# Patient Record
Sex: Female | Born: 1960
Health system: Southern US, Community
[De-identification: ages and names within clinical notes are randomized; demographics above are authoritative.]

## PROBLEM LIST (undated history)

## (undated) DIAGNOSIS — M8580 Other specified disorders of bone density and structure, unspecified site: Secondary | ICD-10-CM

## (undated) DIAGNOSIS — B019 Varicella without complication: Secondary | ICD-10-CM

## (undated) DIAGNOSIS — I341 Nonrheumatic mitral (valve) prolapse: Secondary | ICD-10-CM

## (undated) DIAGNOSIS — T783XXA Angioneurotic edema, initial encounter: Secondary | ICD-10-CM

## (undated) DIAGNOSIS — T7840XA Allergy, unspecified, initial encounter: Secondary | ICD-10-CM

## (undated) DIAGNOSIS — M419 Scoliosis, unspecified: Secondary | ICD-10-CM

## (undated) DIAGNOSIS — IMO0002 Reserved for concepts with insufficient information to code with codable children: Secondary | ICD-10-CM

## (undated) DIAGNOSIS — Z87898 Personal history of other specified conditions: Secondary | ICD-10-CM

## (undated) HISTORY — DX: Scoliosis, unspecified: M41.9

## (undated) HISTORY — DX: Angioneurotic edema, initial encounter: T78.3XXA

## (undated) HISTORY — DX: Reserved for concepts with insufficient information to code with codable children: IMO0002

## (undated) HISTORY — DX: Varicella without complication: B01.9

## (undated) HISTORY — DX: Allergy, unspecified, initial encounter: T78.40XA

## (undated) HISTORY — DX: Nonrheumatic mitral (valve) prolapse: I34.1

## (undated) HISTORY — DX: Other specified disorders of bone density and structure, unspecified site: M85.80

## (undated) HISTORY — PX: WISDOM TOOTH EXTRACTION: SHX21

## (undated) HISTORY — DX: Personal history of other specified conditions: Z87.898

---

## 1990-04-11 HISTORY — PX: LAPAROSCOPIC ABDOMINAL EXPLORATION: SHX6249

## 1997-09-09 ENCOUNTER — Other Ambulatory Visit: Admission: RE | Admit: 1997-09-09 | Discharge: 1997-09-09 | Payer: Self-pay | Admitting: Gynecology

## 1999-02-22 ENCOUNTER — Other Ambulatory Visit: Admission: RE | Admit: 1999-02-22 | Discharge: 1999-02-22 | Payer: Self-pay | Admitting: Gynecology

## 2000-03-06 ENCOUNTER — Other Ambulatory Visit: Admission: RE | Admit: 2000-03-06 | Discharge: 2000-03-06 | Payer: Self-pay | Admitting: Gynecology

## 2001-03-13 ENCOUNTER — Other Ambulatory Visit: Admission: RE | Admit: 2001-03-13 | Discharge: 2001-03-13 | Payer: Self-pay | Admitting: Gynecology

## 2002-03-15 ENCOUNTER — Other Ambulatory Visit: Admission: RE | Admit: 2002-03-15 | Discharge: 2002-03-15 | Payer: Self-pay | Admitting: Gynecology

## 2004-03-12 ENCOUNTER — Other Ambulatory Visit: Admission: RE | Admit: 2004-03-12 | Discharge: 2004-03-12 | Payer: Self-pay | Admitting: Gynecology

## 2005-03-29 ENCOUNTER — Other Ambulatory Visit: Admission: RE | Admit: 2005-03-29 | Discharge: 2005-03-29 | Payer: Self-pay | Admitting: Gynecology

## 2006-04-12 ENCOUNTER — Other Ambulatory Visit: Admission: RE | Admit: 2006-04-12 | Discharge: 2006-04-12 | Payer: Self-pay | Admitting: Gynecology

## 2007-04-19 ENCOUNTER — Other Ambulatory Visit: Admission: RE | Admit: 2007-04-19 | Discharge: 2007-04-19 | Payer: Self-pay | Admitting: Gynecology

## 2009-08-25 ENCOUNTER — Ambulatory Visit: Payer: Self-pay | Admitting: Internal Medicine

## 2009-08-25 DIAGNOSIS — Z780 Asymptomatic menopausal state: Secondary | ICD-10-CM | POA: Insufficient documentation

## 2009-08-25 DIAGNOSIS — T783XXA Angioneurotic edema, initial encounter: Secondary | ICD-10-CM

## 2009-08-25 DIAGNOSIS — I341 Nonrheumatic mitral (valve) prolapse: Secondary | ICD-10-CM | POA: Insufficient documentation

## 2009-08-25 LAB — CONVERTED CEMR LAB
ALT: 19 units/L (ref 0–35)
AST: 21 units/L (ref 0–37)
Albumin: 4.6 g/dL (ref 3.5–5.2)
Anti Nuclear Antibody(ANA): NEGATIVE
Chloride: 103 meq/L (ref 96–112)
Direct LDL: 152.8 mg/dL
Eosinophils Relative: 2.7 % (ref 0.0–5.0)
GFR calc non Af Amer: 101.37 mL/min (ref >60)
Glucose, Bld: 88 mg/dL (ref 70–99)
HCT: 41.8 % (ref 36.0–46.0)
Hemoglobin: 14.4 g/dL (ref 12.0–15.0)
Lymphs Abs: 1.1 10*3/uL (ref 0.7–4.0)
MCV: 93.6 fL (ref 78.0–100.0)
Monocytes Absolute: 0.3 10*3/uL (ref 0.1–1.0)
Monocytes Relative: 8.5 % (ref 3.0–12.0)
Neutro Abs: 2.5 10*3/uL (ref 1.4–7.7)
Potassium: 5.4 meq/L — ABNORMAL HIGH (ref 3.5–5.1)
RDW: 13.6 % (ref 11.5–14.6)
Rhuematoid fact SerPl-aCnc: 24 intl units/mL — ABNORMAL HIGH (ref 0.0–20.0)
Sodium: 144 meq/L (ref 135–145)
TSH: 1.99 microintl units/mL (ref 0.35–5.50)
Vit D, 25-Hydroxy: 38 ng/mL (ref 30–89)
WBC: 4.1 10*3/uL — ABNORMAL LOW (ref 4.5–10.5)

## 2009-09-01 ENCOUNTER — Telehealth: Payer: Self-pay | Admitting: Internal Medicine

## 2009-09-08 ENCOUNTER — Encounter: Payer: Self-pay | Admitting: Internal Medicine

## 2009-09-14 ENCOUNTER — Ambulatory Visit: Payer: Self-pay | Admitting: Internal Medicine

## 2009-09-14 DIAGNOSIS — M129 Arthropathy, unspecified: Secondary | ICD-10-CM | POA: Insufficient documentation

## 2009-10-06 ENCOUNTER — Encounter: Payer: Self-pay | Admitting: Internal Medicine

## 2010-05-13 NOTE — Assessment & Plan Note (Signed)
Summary: cpx/ssc   Vital Signs:  Patient profile:   50 year old female Menstrual status:  postmenopausal Height:      65.25 inches Weight:      129 pounds Pulse rate:   72 / minute BP sitting:   110 / 60  (left arm) Cuff size:   regular  Vitals Entered By: Romualdo Bolk, CMA (AAMA) (September 14, 2009 8:50 AM) CC: CPX without pap- Pt has a gyn who does paps.   History of Present Illness: Jenny Haley comes in today   for preventive visit and follow up of her angioedema of lip.  She has seen allergist and is in process of evaluation . Vistaril no help and now prednisone has helped . Is doing a calendar for allergist.  Prednisone was 40 mg  per day   now  20 mg per day. . No new signs  . does haave some joint aches hands but not new. No other change in health status. Asks about SBE prophylaxis at dentist  Preventive Care Screening  Last Tetanus Booster:    Date:  04/12/2003    Results:  Tdap   Prior Values:    Pap Smear:  normal (06/16/2009)    Mammogram:  normal (06/22/2009)   Preventive Screening-Counseling & Management  Alcohol-Tobacco     Alcohol drinks/day: 0     Smoking Status: never  Caffeine-Diet-Exercise     Caffeine use/day: 0     Does Patient Exercise: yes     Type of exercise: walking      Exercise (avg: min/session): 2 miles      Times/week: 3  Hep-HIV-STD-Contraception     Dental Visit-last 6 months yes     Sun Exposure-Excessive: no  Safety-Violence-Falls     Seat Belt Use: yes     Helmet Use: yes     Firearms in the Home: firearms in the home     Firearm Counseling: not indicated; uses recommended firearm safety measures     Smoke Detectors: yes      Drug Use:  no.        Blood Transfusions:  no.    Current Medications (verified): 1)  Multivitamins   Tabs (Multiple Vitamin) 2)  Fish Oil   Oil (Fish Oil) 3)  Allegra-D 12 Hour 60-120 Mg Xr12h-Tab (Fexofenadine-Pseudoephedrine) 4)  Prednisone 20 Mg Tabs (Prednisone) .... To Take As  Needed For Allergic Reactions. Given By Dr. Stevphen Rochester  Allergies (verified): 1)  ! Penicillin  Past History:  Past medical, surgical, family and social histories (including risk factors) reviewed, and no changes noted (except as noted below).  Past Medical History: primiparous Varicella as a child syncope as a child  Mitral valve prolapse   echo  in 20s    ? Endometriosis  INfertility.  scoliosis. Angioedema   Past Surgical History: Reviewed history from 08/25/2009 and no changes required. Laparotomy-exploratory- 1991 fertility   Past History:  Care Management: Gynecology:  Gaetana Michaelis, [place] Dermatology: Emily Filbert- in the past Allergy: [place]  Family History: Reviewed history from 08/25/2009 and no changes required. Father: Healthy Mother: Arthritis, High cholesterol, HBP  Siblings: Brother- Polycystic Kidney disease Scoliosis    female   on fathers side.   Social History: Reviewed history from 08/25/2009 and no changes required. 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  Never Smoked Alcohol use-no Drug use-no Regular exercise-yes Adopted child  turned  18  no pets  HHof 3  Dental Care w/in 6 mos.:  yes Sun Exposure-Excessive:  no Blood Transfusions:  no  Review of Systems  The patient denies anorexia, fever, weight loss, weight gain, vision loss, decreased hearing, hoarseness, syncope, prolonged cough, abdominal pain, melena, hematochezia, hematuria, abnormal bleeding, and enlarged lymph nodes.    Physical Exam  General:  Well-developed,well-nourished,in no acute distress; alert,appropriate and cooperative throughout examination Head:  normocephalic and atraumatic.   Eyes:  PERRL, EOMs full, conjunctiva clear  Ears:  R ear normal, L ear normal, and no external deformities.   Nose:  no external deformity, no external erythema, and no nasal discharge.   Mouth:  good dentition and pharynx pink and moist.   Neck:  No  deformities, masses, or tenderness noted. Chest Wall:  No deformities, masses, or tenderness noted. Breasts:  No mass, nodules, thickening, tenderness, bulging, retraction, inflamation, nipple discharge or skin changes noted.   Lungs:  Normal respiratory effort, chest expands symmetrically. Lungs are clear to auscultation, no crackles or wheezes.no dullness.   Heart:  Normal rate and regular rhythm. S1 and S2 normal without gallop, murmur, , rub or other extra sounds. OIntermittent mid syst click without murmur  in supine  Abdomen:  Bowel sounds positive,abdomen soft and non-tender without masses, organomegaly or hernias noted. Genitalia:  per gyne  Msk:  no joint swelling, no joint warmth, and no redness over joints.  PIP nodules no synovitis seen  no arachnodactaly   Scoliosis  Pulses:  pulses intact without delay   Extremities:  no clubbing cyanosis or edema  Neurologic:  cranial nerves II-XII intact, strength normal in all extremities, gait normal, and DTRs symmetrical and normal.   Skin:  turgor normal, color normal, no ecchymoses, no petechiae, and no edema.   Cervical Nodes:  No lymphadenopathy noted Axillary Nodes:  No palpable lymphadenopathy Inguinal Nodes:  No significant adenopathy Psych:  Oriented X3, good eye contact, not anxious appearing, and not depressed appearing.   Labs reviewed   mild neutropenia ,  RF positive .    EKG NSR   no acute changes   Impression & Recommendations:  Problem # 1:  PREVENTIVE HEALTH CARE (ICD-V70.0)  suspect the potassium is realted to blood draw.   Orders: EKG w/ Interpretation (93000)  Problem # 2:  ANGIOEDEMA (ICD-995.1)  under evaluation  Orders: Rheumatology Referral (Rheumatology)  Problem # 3:  MITRAL VALVE PROLAPSE (ICD-424.0) ekg nl and no murmur ocass click  Problem # 4:  ARTHRITIS (ICD-716.90)  pos serology  RF ,  but nl esr  done pre  prednisone.  dosing .    Orders: Rheumatology Referral (Rheumatology)  Complete  Medication List: 1)  Multivitamins Tabs (Multiple vitamin) 2)  Fish Oil Oil (Fish oil) 3)  Allegra-d 12 Hour 60-120 Mg Xr12h-tab (Fexofenadine-pseudoephedrine) 4)  Prednisone 20 Mg Tabs (Prednisone) .... To take as needed for allergic reactions. given by dr. Stevphen Rochester  Patient Instructions: 1)  Will be contacted about rheumatology  consult . 2)  Your arthritis is probably osteoarthritis .  3)  No need for antibioitc prophylaxis  for dental procedures.

## 2010-05-13 NOTE — Consult Note (Signed)
Summary: [facility]   Imported By: Maryln Gottron 10/19/2009 12:49:14  _____________________________________________________________________  External Attachment:    Type:   Image     Comment:   External Document

## 2010-05-13 NOTE — Assessment & Plan Note (Signed)
Summary: NEW PT EST // RS   Vital Signs:  Patient profile:   50 year old female Menstrual status:  postmenopausal Height:      65 inches Weight:      131 pounds BMI:     21.88 Temp:     98.1 degrees F oral Pulse rate:   54 / minute BP sitting:   120 / 80  (right arm)  Vitals Entered By: Kathrynn Speed CMA (Aug 25, 2009 9:00 AM) CC: CPX / new pt established     Menstrual Status postmenopausal Last PAP Result normal   History of Present Illness: Tell patient that comesin comes in today  for  new patient visit.  Last PCP  2 year ago. last labs normal   Problem with local lip swelling     recurrent  . this  happens in the evening and awakens in night  and was seen in urgent care  and was given with vistaril     sometimes swells  to ear  . lower right lip  gets tingly   and about 8  times   no associated    NVD resp problem .      no family  hx .       Has picture of swellig and no redness. Hives with strawberries .        See new patient hx .    Preventive Care Screening  Mammogram:    Date:  06/22/2009    Results:  normal   Pap Smear:    Date:  06/16/2009    Results:  normal    Preventive Screening-Counseling & Management  Alcohol-Tobacco     Alcohol drinks/day: 0     Smoking Status: never  Caffeine-Diet-Exercise     Caffeine use/day: 0     Does Patient Exercise: yes     Type of exercise: walking      Exercise (avg: min/session): 2 miles      Times/week: 3  Safety-Violence-Falls     Seat Belt Use: yes     Helmet Use: yes     Firearms in the Home: firearms in the home     Firearm Counseling: not indicated; uses recommended firearm safety measures     Smoke Detectors: yes      Drug Use:  no.    Current Medications (verified): 1)  Multivitamins   Tabs (Multiple Vitamin) 2)  Fish Oil   Oil (Fish Oil)  Allergies (verified): 1)  ! Penicillin  Past History:  Past Medical History: primiparous Varicella as a child suncope as a child  Mitral valve  prolapse   echo  in 20s    ? Endometriosis  INfertility.   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  Never Smoked Alcohol use-no Drug use-no Regular exercise-yes Adopted child  turned  18  no pets  HHof 3  Smoking Status:  never Caffeine use/day:  0 Does Patient Exercise:  yes Occupation:  employed Drug Use:  no Seat Belt Use:  yes  Review of Systems  The patient denies anorexia, fever, weight loss, weight gain, vision loss, decreased hearing, hoarseness, chest pain, syncope, dyspnea on exertion, peripheral edema, prolonged cough, headaches, hemoptysis, abdominal pain,  melena, hematochezia, severe indigestion/heartburn, hematuria, incontinence, muscle weakness, suspicious skin lesions, transient blindness, difficulty walking, depression, unusual weight change, abnormal bleeding, enlarged lymph nodes, and breast masses.         mild arthritis in hands no rashes or itching and no seasonal allergies   Physical Exam  General:  Well-developed,well-nourished,in no acute distress; alert,appropriate and cooperative throughout examination Head:  normocephalic and atraumatic.   Eyes:  vision grossly intact, pupils equal, and pupils round.   Ears:  R ear normal, L ear normal, and no external deformities.   Nose:  no external deformity and no external erythema.   Mouth:  good dentition and pharynx pink and moist.  some cobblestoning  Neck:  No deformities, masses, or tenderness noted. Lungs:  Normal respiratory effort, chest expands symmetrically. Lungs are clear to auscultation, no crackles or wheezes.no dullness.   Heart:  Normal rate and regular rhythm. S1 and  S2 normal without gallop, murmur, click, rub or other extra sounds. I dont hear a click today  Abdomen:  Bowel sounds positive,abdomen soft and non-tender without masses, organomegaly or hernias noted. Msk:  mild dip changes no effusions and no rashes  Pulses:  pulses intact without delay   Extremities:  no clubbing cyanosis or edema  Neurologic:  alert & oriented X3, strength normal in all extremities, and gait normal.  grossly normal  Skin:  turgor normal, color normal, no ecchymoses, and no petechiae.   Cervical Nodes:  No lymphadenopathy noted Psych:  Normal eye contact, appropriate affect. Cognition appears normal.    Impression & Recommendations:  Problem # 1:  ANGIOEDEMA (ICD-995.1) unknown cause  fortunately localized and no alarm findings   / cause  Orders: TLB-BMP (Basic Metabolic Panel-BMET) (80048-METABOL) TLB-CBC Platelet - w/Differential (85025-CBCD) TLB-Hepatic/Liver Function Pnl (80076-HEPATIC) TLB-TSH (Thyroid Stimulating Hormone) (84443-TSH) TLB-Lipid Panel (80061-LIPID) TLB-Sedimentation Rate (ESR) (85652-ESR) TLB-Rheumatoid Factor (RA) (16109-UE) T-Antinuclear Antib (ANA) [phone]) T-Vitamin D (25-Hydroxy) (47829-56213) Venipuncture (08657) Allergy Referral  (Allergy)  Problem # 2:  MITRAL VALVE PROLAPSE (ICD-424.0) no murmur heard    Orders: TLB-BMP (Basic Metabolic Panel-BMET) (80048-METABOL) TLB-CBC Platelet - w/Differential (85025-CBCD) TLB-Hepatic/Liver Function Pnl (80076-HEPATIC) TLB-TSH (Thyroid Stimulating Hormone) (84443-TSH) TLB-Lipid Panel (80061-LIPID) TLB-Sedimentation Rate (ESR) (85652-ESR) TLB-Rheumatoid Factor (RA) (84696-EX) T-Antinuclear Antib (ANA) [phone]) T-Vitamin D (25-Hydroxy) [phone])  Problem # 3:  ASYMPTOMATIC POSTMENOPAUSAL STATUS (ICD-V49.81)  Orders: T-Vitamin D (25-Hydroxy) (40347-42595)  Complete Medication List: 1)  Multivitamins Tabs (Multiple vitamin) 2)  Fish Oil Oil (Fish oil) 3)  Hydroxyzine  Pamoate 25 Mg Caps (Hydroxyzine pamoate) .Marland Kitchen.. 1 by mouth three times a day as needed swelling allergic reaction  Patient Instructions: 1)  schedule for CPX  ( any slot but thursday am  2)  Will notify you about allergy consult. 3)  Can try vistaryl in the meantime if necessary.  Prescriptions: HYDROXYZINE PAMOATE 25 MG CAPS (HYDROXYZINE PAMOATE) 1 by mouth three times a day as needed swelling allergic reaction  #30 x 1   Entered and Authorized by:   Madelin Headings MD   Signed by:   Madelin Headings MD on 08/25/2009   Method used:   Electronically to        [facility]* (retail)       [address]       [place], [place]  [id number]       Ph: [phone]       Fax: [phone]   RxID:   [id number]

## 2010-05-13 NOTE — Consult Note (Signed)
Summary: [facility]  [facility]   Imported By: Maryln Gottron 10/23/2009 15:18:22  _____________________________________________________________________  External Attachment:    Type:   Image     Comment:   External Document

## 2010-05-13 NOTE — Progress Notes (Signed)
Summary: Pt saw urgent care on sat  Phone Note Call from Patient Call back at Home Phone [phone] Call back at [phone]   Caller: Patient Summary of Call: Pt called saying that she had to see an Urgent care on Sat for allergic reaction. Pt was told that she needed to d/c antihistimines 4 days prior to appt. Pt is also going to start prednisone today. Pt said to leave her a voicemail on what to do. I left her a message to call us back to let us know what is going on and to call the allergist that she is seeing to see what they want her to do. Initial call taken by: Romualdo Bolk, CMA Duncan Dull),  Sep 01, 2009 10:41 AM  Follow-up for Phone Call        Pt called back saying that she she had a  allergy reaction that started on 5/21 until 5/22. It lasted more thatn 12 hours to left side of lower lip and left side of face with swelling and itching. Pt went to Urgent Care on [address]. Follow-up by: Romualdo Bolk, CMA Duncan Dull),  Sep 01, 2009 12:44 PM  Additional Follow-up for Phone Call Additional follow up Details #1::        Spoke to pt and they gave her singulair and prednisone at [facility] . Vistril did nothing. Pt states that she called [facility] and they told her that she could take Singulair and Prednisone. Additional Follow-up by: Romualdo Bolk, CMA Duncan Dull),  Sep 01, 2009 12:47 PM

## 2011-06-09 DIAGNOSIS — J309 Allergic rhinitis, unspecified: Secondary | ICD-10-CM | POA: Insufficient documentation

## 2013-01-09 LAB — HM MAMMOGRAPHY: HM Mammogram: NORMAL

## 2013-03-13 ENCOUNTER — Ambulatory Visit (INDEPENDENT_AMBULATORY_CARE_PROVIDER_SITE_OTHER): Payer: BC Managed Care – PPO | Admitting: Internal Medicine

## 2013-03-13 ENCOUNTER — Encounter: Payer: Self-pay | Admitting: Internal Medicine

## 2013-03-13 VITALS — BP 122/84 | HR 94 | Temp 98.1°F | Ht 66.0 in | Wt 133.0 lb

## 2013-03-13 DIAGNOSIS — Z1211 Encounter for screening for malignant neoplasm of colon: Secondary | ICD-10-CM

## 2013-03-13 DIAGNOSIS — M8580 Other specified disorders of bone density and structure, unspecified site: Secondary | ICD-10-CM

## 2013-03-13 DIAGNOSIS — Z0000 Encounter for general adult medical examination without abnormal findings: Secondary | ICD-10-CM

## 2013-03-13 DIAGNOSIS — M899 Disorder of bone, unspecified: Secondary | ICD-10-CM

## 2013-03-13 DIAGNOSIS — Z780 Asymptomatic menopausal state: Secondary | ICD-10-CM

## 2013-03-13 DIAGNOSIS — I059 Rheumatic mitral valve disease, unspecified: Secondary | ICD-10-CM

## 2013-03-13 DIAGNOSIS — R1011 Right upper quadrant pain: Secondary | ICD-10-CM

## 2013-03-13 LAB — CBC WITH DIFFERENTIAL/PLATELET
Basophils Relative: 0.4 % (ref 0.0–3.0)
Eosinophils Absolute: 0.1 10*3/uL (ref 0.0–0.7)
Eosinophils Relative: 2.6 % (ref 0.0–5.0)
Lymphocytes Relative: 20.3 % (ref 12.0–46.0)
Lymphs Abs: 1 10*3/uL (ref 0.7–4.0)
MCHC: 33.7 g/dL (ref 30.0–36.0)
Monocytes Relative: 8.6 % (ref 3.0–12.0)
Neutrophils Relative %: 68.1 % (ref 43.0–77.0)
RBC: 4.61 Mil/uL (ref 3.87–5.11)
WBC: 5.1 10*3/uL (ref 4.5–10.5)

## 2013-03-13 LAB — LIPID PANEL
Cholesterol: 252 mg/dL — ABNORMAL HIGH (ref 0–200)
HDL: 69 mg/dL (ref >39.00)
VLDL: 26.8 mg/dL (ref 0.0–40.0)

## 2013-03-13 LAB — BASIC METABOLIC PANEL
CO2: 28 mEq/L (ref 19–32)
Calcium: 9.6 mg/dL (ref 8.4–10.5)
Creatinine, Ser: 0.7 mg/dL (ref 0.4–1.2)
GFR: 99.93 mL/min (ref >60.00)
Sodium: 139 mEq/L (ref 135–145)

## 2013-03-13 LAB — TSH: TSH: 0.76 u[IU]/mL (ref 0.35–5.50)

## 2013-03-13 LAB — HEPATIC FUNCTION PANEL
ALT: 17 U/L (ref 0–35)
AST: 22 U/L (ref 0–37)
Alkaline Phosphatase: 57 U/L (ref 39–117)
Bilirubin, Direct: 0 mg/dL (ref 0.0–0.3)
Total Protein: 7.5 g/dL (ref 6.0–8.3)

## 2013-03-13 LAB — LDL CHOLESTEROL, DIRECT: Direct LDL: 162.2 mg/dL

## 2013-03-13 NOTE — Progress Notes (Signed)
Chief Complaint  Patient presents with  . Establish Care    Needs to re-establish    HPI: Patient comes in today for reestablisingCare visit  Last seen over 3 years ago  GYNE: dr Chevis Pretty   Is generally well but had a hard year last year father was diagnosed with esophageal cancer and died within 6 weeks the diagnosis mother had endometrial cancer although is doing well now.  Major injuries but did have an episode of right upper quadrant pain after vacuuming doing housework felt to be musculoskeletal given Mobic and a muscle relaxant is much better but advice to recheck her physical exam.  Angioedema no recurrence but sees allergist yearly hasn't needed to use the EpiPen  Mitral valve prolapse diagnosed with echo long time ago no associated symptoms tachycardia asymptomatic.  1 density reported it osteopenia was told to take calcium vitamin D has some questions about that.  Declines flu vaccine  Never hda colonoscopy  Is due   ROS:  GEN/ HEENT: No fever, significant weight changes sweats headaches vision problems hearing changes, CV/ PULM; No chest pain shortness of breath cough, syncope,edema  change in exercise tolerance. GI /GU: No adominal pain, vomiting, change in bowel habits. No blood in the stool. No significant GU symptoms. SKIN/HEME: ,no acute skin rashes suspicious lesions or bleeding. No lymphadenopathy, nodules, masses.  NEURO/ PSYCH:  No neurologic signs such as weakness numbness. No depression anxiety. IMM/ Allergy: No unusual infections.  Allergy .   REST of 12 system review negative except as per HPI   Past Medical History  Diagnosis Date  . H/O angioedema     neg rheum pos rf dr Dareen Piano 2011  . Mitral valve prolapse     echo years ago fainting in childhood   . Allergy   . Angioedema     eval by rheum and allergy dr Irena Cords now  . Osteopenia   . Chicken pox   . Infertility     Family History  Problem Relation Age of Onset  . Cancer Mother 57     Endometrial  . Hyperlipidemia Mother   . Hypertension Mother   . Arthritis Mother     hands   . Esophageal cancer Father     Esophageal with metastisis to the liver  . Polycystic kidney disease Brother     congental cr 5 range transplan candidate  . Hypertension Brother     History   Social History  . Marital Status: Married    Spouse Name: N/A    Number of Children: N/A  . Years of Education: N/A   Social History Main Topics  . Smoking status: Never Smoker   . Smokeless tobacco: None  . Alcohol Use: No  . Drug Use: None  . Sexual Activity: None   Other Topics Concern  . None   Social History Narrative   Sleeps about 6-7 hours of sleep per night (thanks to menopause)   2 people living in the home   Has a small dog in the home    p0 g0   No tobacco ets    Husband IT  [employer] degree  Homemaker  Caring for parents    Neg tad    Outpatient Encounter Prescriptions as of 03/13/2013  Medication Sig  . cyclobenzaprine (FLEXERIL) 5 MG tablet 1-2 tablets by mouth up to three times daily  . meloxicam (MOBIC) 15 MG tablet Take 15 mg by mouth daily. With a meal  .  Multiple Vitamin (MULTI VITAMIN DAILY PO) Take by mouth.    EXAM:  BP 122/84  Pulse 94  Temp(Src) 98.1 F (36.7 C) (Oral)  Ht 5\' 6"  (1.676 m)  Wt 133 lb (60.328 kg)  BMI 21.48 kg/m2  SpO2 98%  Body mass index is 21.48 kg/(m^2).  Physical Exam: Vital signs reviewed WUJ:WJXB is a well-developed well-nourished alert cooperative   female who appears her stated age in no acute distress.  HEENT: normocephalic atraumatic , Eyes: PERRL EOM's full, conjunctiva clear, Nares: paten,t no deformity discharge or tenderness., Ears: no deformity EAC's clear TMs with normal landmarks. Mouth: clear OP, no lesions, edema.  Moist mucous membranes. Dentition in adequate repair. NECK: supple without masses, thyromegaly or bruits. CHEST/PULM:  Clear to auscultation and percussion breath sounds equal no wheeze , rales  or rhonchi.no tenderness has mild scoliosis assymmetry  CV: PMI is nondisplaced, S1 S2 no gallops, murmurs, rubs. ? intermettent click supine only Peripheral pulses are full without delay.No JVD .  ABDOMEN: Bowel sounds normal nontender  No guard or rebound, no hepato splenomegal no CVA tenderness.  No hernia. Area of min tender mid ruq near rib cage and musc insertion no mass felt Extremtities:  No clubbing cyanosis or edema, no acute joint swelling or redness no focal atrophy NEURO:  Oriented x3, cranial nerves 3-12 appear to be intact, no obvious focal weakness,gait within normal limitsSKIN: No acute rashes normal turgor, color, no bruising or petechiae. PSYCH: Oriented, good eye contact, no obvious depression anxiety, cognition and judgment appear normal. LN: no cervical adenopathy  Lab Results  Component Value Date   WBC 5.1 03/13/2013   HGB 14.3 03/13/2013   HCT 42.5 03/13/2013   PLT 314.0 03/13/2013   GLUCOSE 75 03/13/2013   CHOL 252* 03/13/2013   TRIG 134.0 03/13/2013   HDL 69.00 03/13/2013   LDLDIRECT 162.2 03/13/2013   ALT 17 03/13/2013   AST 22 03/13/2013   NA 139 03/13/2013   K 3.8 03/13/2013   CL 104 03/13/2013   CREATININE 0.7 03/13/2013   BUN 18 03/13/2013   CO2 28 03/13/2013   TSH 0.76 03/13/2013    ASSESSMENT AND PLAN:  Discussed the following assessment and plan:  Visit for preventive health examination - declines flu vaccine - Plan: Multiple Vitamin (MULTI VITAMIN DAILY PO), cyclobenzaprine (FLEXERIL) 5 MG tablet, meloxicam (MOBIC) 15 MG tablet, HM MAMMOGRAPHY, HM PAP SMEAR, HM DEXA SCAN, Basic metabolic panel, CBC with Differential, Hepatic function panel, Lipid panel, TSH, Hepatitis C antibody  MITRAL VALVE PROLAPSE - no m heard and no sx follow exam - Plan: Multiple Vitamin (MULTI VITAMIN DAILY PO), cyclobenzaprine (FLEXERIL) 5 MG tablet, meloxicam (MOBIC) 15 MG tablet, HM MAMMOGRAPHY, HM PAP SMEAR, HM DEXA SCAN, Basic metabolic panel, CBC with Differential, Hepatic function  panel, Lipid panel, TSH, Hepatitis C antibody  ASYMPTOMATIC POSTMENOPAUSAL STATUS - Plan: Multiple Vitamin (MULTI VITAMIN DAILY PO), cyclobenzaprine (FLEXERIL) 5 MG tablet, meloxicam (MOBIC) 15 MG tablet, HM MAMMOGRAPHY, HM PAP SMEAR, HM DEXA SCAN, Basic metabolic panel, CBC with Differential, Hepatic function panel, Lipid panel, TSH, Hepatitis C antibody  RUQ abdominal pain hx improved  - poss ms consider  [place] if recurrent progressive etc  - Plan: Multiple Vitamin (MULTI VITAMIN DAILY PO), cyclobenzaprine (FLEXERIL) 5 MG tablet, meloxicam (MOBIC) 15 MG tablet, HM MAMMOGRAPHY, HM PAP SMEAR, HM DEXA SCAN, Basic metabolic panel, CBC with Differential, Hepatic function panel, Lipid panel, TSH, Hepatitis C antibody  Osteopenia - Plan: Multiple Vitamin (MULTI VITAMIN DAILY PO), cyclobenzaprine (FLEXERIL)  5 MG tablet, meloxicam (MOBIC) 15 MG tablet, HM MAMMOGRAPHY, HM PAP SMEAR, HM DEXA SCAN, Basic metabolic panel, CBC with Differential, Hepatic function panel, Lipid panel, TSH, Hepatitis C antibody  Special screening for malignant neoplasms, colon - Plan: Ambulatory referral to Gastroenterology Counseled regarding healthy nutrition, exercise, sleep, injury prevention, calcium vit d and healthy weight .  Patient Care Team: Madelin Headings, MD as PCP - General Scharlene Gloss, MD (Allergy and Immunology) Janifer Adie, MD as Consulting Physician (Gynecology) Patient Instructions  150 minutes of exercise weeks  , weight  To healthy levels. Avoid trans fats and processed foods;  Increase fresh fruits and veges to 5 servings per day. And avoid sweet beverages  Including tea and juice. Will notify you  of labs when available. Reconsider flu vaccine at any time/ Advise vit d 600 - 800 per day vitr d 3  Many peopl use the 1000 iu supplement   Will refer for colonoscopy     Bone Health Our bones do many things. They provide structure, protect organs, anchor muscles, and store calcium. Adequate calcium  in your diet and weight-bearing physical activity help build strong bones, improve bone amounts, and may reduce the risk of weakening of bones (osteoporosis) later in life. PEAK BONE MASS By age 73, the average woman has acquired most of her skeletal bone mass. A large decline occurs in older adults which increases the risk of osteoporosis. In women this occurs around the time of menopause. It is important for young girls to reach their peak bone mass in order to maintain bone health throughout life. A person with high bone mass as a young adult will be more likely to have a higher bone mass later in life. Not enough calcium consumption and physical activity early on could result in a failure to achieve optimum bone mass in adulthood. OSTEOPOROSIS Osteoporosis is a disease of the bones. It is defined as low bone mass with deterioration of bone structure. Osteoporosis leads to an increase risk of fractures with falls. These fractures commonly happen in the wrist, hip, and spine. While men and women of all ages and background can develop osteoporosis, some of the risk factors for osteoporosis are:  Female.  White.  Postmenopausal.  Older adults.  Small in body size.  Eating a diet low in calcium.  Physically inactive.  Smoking.  Use of some medications.  Family history. CALCIUM Calcium is a mineral needed by the body for healthy bones, teeth, and proper function of the heart, muscles, and nerves. The body cannot produce calcium so it must be absorbed through food. Good sources of calcium include:  Dairy products (low fat or nonfat milk, cheese, and yogurt).  Dark green leafy vegetables (bok choy and broccoli).  Calcium fortified foods (orange juice, cereal, bread, soy beverages, and tofu products).  Nuts (almonds). Recommended amounts of calcium vary for individuals. RECOMMENDED CALCIUM INTAKES Age and Amount in mg per day  Children 1 to 3 years / 700 mg  Children 4 to 8  years / 1,000 mg  Children 9 to 13 years / 1,300 mg  Teens 14 to 18 years / 1,300 mg  Adults 19 to 50 years / 1,000 mg  Adult women 51 to 70 years / 1,200 mg  Adults 71 years and older / 1,200 mg  Pregnant and breastfeeding teens / 1,300 mg  Pregnant and breastfeeding adults / 1,000 mg Vitamin D also plays an important role in healthy bone development. Vitamin  D helps in the absorption of calcium. WEIGHT-BEARING PHYSICAL ACTIVITY Regular physical activity has many positive health benefits. Benefits include strong bones. Weight-bearing physical activity early in life is important in reaching peak bone mass. Weight-bearing physical activities cause muscles and bones to work against gravity. Some examples of weight bearing physical activities include:  Walking, jogging, or running.  DIRECTV.  Jumping rope.  Dancing.  Soccer.  Tennis or Racquetball.  Stair climbing.  Basketball.  Hiking.  Weight lifting.  Aerobic fitness classes. Including weight-bearing physical activity into an exercise plan is a great way to keep bones healthy. Adults: Engage in at least 30 minutes of moderate physical activity on most, preferably all, days of the week. Children: Engage in at least 60 minutes of moderate physical activity on most, preferably all, days of the week. FOR MORE INFORMATION [place] [employer] for [employer] and Promotion: www.cnpp.usda.gov National Osteoporosis Foundation: [employer] Document Released: 06/18/2003 Document Revised: 07/23/2012 Document Reviewed: 09/17/2008 [facility] Patient Information 2014 [place], [place].      Neta Mends. Aniken Monestime M.D.   Pre visit review using our clinic review tool, if applicable. No additional management support is needed unless otherwise documented below in the visit note.

## 2013-03-13 NOTE — Patient Instructions (Signed)
150 minutes of exercise weeks  , weight  To healthy levels. Avoid trans fats and processed foods;  Increase fresh fruits and veges to 5 servings per day. And avoid sweet beverages  Including tea and juice. Will notify you  of labs when available. Reconsider flu vaccine at any time/ Advise vit d 600 - 800 per day vitr d 3  Many peopl use the 1000 iu supplement   Will refer for colonoscopy     Bone Health Our bones do many things. They provide structure, protect organs, anchor muscles, and store calcium. Adequate calcium in your diet and weight-bearing physical activity help build strong bones, improve bone amounts, and may reduce the risk of weakening of bones (osteoporosis) later in life. PEAK BONE MASS By age 64, the average woman has acquired most of her skeletal bone mass. A large decline occurs in older adults which increases the risk of osteoporosis. In women this occurs around the time of menopause. It is important for young girls to reach their peak bone mass in order to maintain bone health throughout life. A person with high bone mass as a young adult will be more likely to have a higher bone mass later in life. Not enough calcium consumption and physical activity early on could result in a failure to achieve optimum bone mass in adulthood. OSTEOPOROSIS Osteoporosis is a disease of the bones. It is defined as low bone mass with deterioration of bone structure. Osteoporosis leads to an increase risk of fractures with falls. These fractures commonly happen in the wrist, hip, and spine. While men and women of all ages and background can develop osteoporosis, some of the risk factors for osteoporosis are:  Female.  White.  Postmenopausal.  Older adults.  Small in body size.  Eating a diet low in calcium.  Physically inactive.  Smoking.  Use of some medications.  Family history. CALCIUM Calcium is a mineral needed by the body for healthy bones, teeth, and proper function of  the heart, muscles, and nerves. The body cannot produce calcium so it must be absorbed through food. Good sources of calcium include:  Dairy products (low fat or nonfat milk, cheese, and yogurt).  Dark green leafy vegetables (bok choy and broccoli).  Calcium fortified foods (orange juice, cereal, bread, soy beverages, and tofu products).  Nuts (almonds). Recommended amounts of calcium vary for individuals. RECOMMENDED CALCIUM INTAKES Age and Amount in mg per day  Children 52 years / 700 mg  Children 52 years / 1,000 mg  Children 52 years / 1,300 mg  Teens 52 years / 1,300 mg  Adults 52 to 52 years / 1,000 mg  Adult women 52 to 52 years / 1,200 mg  Adults 71 years and older / 1,200 mg  Pregnant and breastfeeding teens / 1,300 mg  Pregnant and breastfeeding adults / 1,000 mg Vitamin D also plays an important role in healthy bone development. Vitamin D helps in the absorption of calcium. WEIGHT-BEARING PHYSICAL ACTIVITY Regular physical activity has many positive health benefits. Benefits include strong bones. Weight-bearing physical activity early in life is important in reaching peak bone mass. Weight-bearing physical activities cause muscles and bones to work against gravity. Some examples of weight bearing physical activities include:  Walking, jogging, or running.  DIRECTV.  Jumping rope.  Dancing.  Soccer.  Tennis or Racquetball.  Stair climbing.  Basketball.  Hiking.  Weight lifting.  Aerobic fitness classes. Including weight-bearing physical activity  into an exercise plan is a great way to keep bones healthy. Adults: Engage in at least 30 minutes of moderate physical activity on most, preferably all, days of the week. Children: Engage in at least 60 minutes of moderate physical activity on most, preferably all, days of the week. FOR MORE INFORMATION [place] [employer], [employer]ographer for [employer] and  Promotion: www.cnpp.usda.gov National Osteoporosis Foundation: [employer] Document Released: 06/18/2003 Document Revised: 07/23/2012 Document Reviewed: 09/17/2008 [facility] Patient Information 2014 [place], [place].

## 2013-03-14 LAB — HEPATITIS C ANTIBODY: HCV Ab: NEGATIVE

## 2013-03-25 ENCOUNTER — Encounter: Payer: Self-pay | Admitting: Gastroenterology

## 2013-04-24 ENCOUNTER — Ambulatory Visit (AMBULATORY_SURGERY_CENTER): Payer: Self-pay | Admitting: *Deleted

## 2013-04-24 VITALS — Ht 66.5 in | Wt 129.8 lb

## 2013-04-24 DIAGNOSIS — Z1211 Encounter for screening for malignant neoplasm of colon: Secondary | ICD-10-CM

## 2013-04-24 MED ORDER — NA SULFATE-K SULFATE-MG SULF 17.5-3.13-1.6 GM/177ML PO SOLN: 1.0000 | Freq: Once | ORAL | Status: DC

## 2013-04-24 NOTE — Progress Notes (Signed)
Denies allergies to eggs or soy products. Denies complications with sedation or anesthesia. 

## 2013-04-25 ENCOUNTER — Encounter: Payer: Self-pay | Admitting: Gastroenterology

## 2013-05-02 ENCOUNTER — Encounter: Payer: Self-pay | Admitting: Gastroenterology

## 2013-05-02 ENCOUNTER — Ambulatory Visit (AMBULATORY_SURGERY_CENTER): Payer: Managed Care, Other (non HMO) | Admitting: Gastroenterology

## 2013-05-02 VITALS — BP 115/74 | HR 79 | Temp 98.2°F | Resp 23 | Ht 66.0 in | Wt 129.0 lb

## 2013-05-02 DIAGNOSIS — Z1211 Encounter for screening for malignant neoplasm of colon: Secondary | ICD-10-CM

## 2013-05-02 MED ORDER — SODIUM CHLORIDE 0.9 % IV SOLN: 500.0000 mL | INTRAVENOUS | Status: DC

## 2013-05-02 NOTE — Op Note (Signed)
[facility] [address] [place], [id number]   COLONOSCOPY PROCEDURE REPORT  PATIENT: [name], Jenny J.  MR#: [id number] BIRTHDATE: 23-Oct-1960 , 52  yrs. old GENDER: Female ENDOSCOPIST: Louis Meckelobert D Mariadelaluz Gug[place], MD REFERRED XB:JYNWGBY:Wanda Lonie PeakK Panosh, M.D. PROCEDURE DATE:  05/02/2013 PROCEDURE:   Colonoscopy, diagnostic First Screening Colonoscopy - Avg.  risk and is 50 yrs.  old or older Yes.  Prior Negative Screening - Now for repeat screening. N/A  History of Adenoma - Now for follow-up colonoscopy & has been > or = to 3 yrs.  N/A  Polyps Removed Today? No.  Recommend repeat exam, <10 yrs? No. ASA CLASS:   Class II INDICATIONS:Average risk patient for colon cancer. MEDICATIONS: propofol (Diprivan) 300mg  IV  DESCRIPTION OF PROCEDURE:   After the risks benefits and alternatives of the procedure were thoroughly explained, informed consent was obtained.  A digital rectal exam revealed no abnormalities of the rectum.   The LB [id number]  endoscope was introduced through the anus and advanced to the cecum, which was identified by both the appendix and ileocecal valve. No adverse events experienced.   The quality of the prep was excellent using Suprep  The instrument was then slowly withdrawn as the colon was fully examined.      COLON FINDINGS: A normal appearing cecum, ileocecal valve, and appendiceal orifice were identified.  The ascending, hepatic flexure, transverse, splenic flexure, descending, sigmoid colon and rectum appeared unremarkable.  No polyps or cancers were seen. Retroflexed views revealed no abnormalities. The time to cecum=3 minutes 46 seconds.  Withdrawal time=11 minutes 21 seconds.  The scope was withdrawn and the procedure completed. COMPLICATIONS: There were no complications.  ENDOSCOPIC IMPRESSION: Normal colon  RECOMMENDATIONS: Continue current colorectal screening recommendations for "routine risk" patients with a repeat colonoscopy  in 10 years.   eSigned:  Louis Meckelobert D Odetta Forness, MD 05/02/2013 2:56 PM   cc:   PATIENT NAME:  [name], Jenny J. MR#: [id number]

## 2013-05-02 NOTE — Progress Notes (Signed)
A/ox3 pleased with MAC, report to Jane RN 

## 2013-05-02 NOTE — Patient Instructions (Signed)
YOU HAD AN ENDOSCOPIC PROCEDURE TODAY AT THE [facility]: Refer to the procedure report that was given to you for any specific questions about what was found during the examination.  If the procedure report does not answer your questions, please call your gastroenterologist to clarify.  If you requested that your care partner not be given the details of your procedure findings, then the procedure report has been included in a sealed envelope for you to review at your convenience later.  YOU SHOULD EXPECT: Some feelings of bloating in the abdomen. Passage of more gas than usual.  Walking can help get rid of the air that was put into your GI tract during the procedure and reduce the bloating. If you had a lower endoscopy (such as a colonoscopy or flexible sigmoidoscopy) you may notice spotting of blood in your stool or on the toilet paper. If you underwent a bowel prep for your procedure, then you may not have a normal bowel movement for a few days.  DIET: Your first meal following the procedure should be a light meal and then it is ok to progress to your normal diet.  A half-sandwich or bowl of soup is an example of a good first meal.  Heavy or fried foods are harder to digest and may make you feel nauseous or bloated.  Likewise meals heavy in dairy and vegetables can cause extra gas to form and this can also increase the bloating.  Drink plenty of fluids but you should avoid alcoholic beverages for 24 hours.  ACTIVITY: Your care partner should take you home directly after the procedure.  You should plan to take it easy, moving slowly for the rest of the day.  You can resume normal activity the day after the procedure however you should NOT DRIVE or use heavy machinery for 24 hours (because of the sedation medicines used during the test).    SYMPTOMS TO REPORT IMMEDIATELY: A gastroenterologist can be reached at any hour.  During normal business hours, 8:30 AM to 5:00 PM Monday through Friday,  call [phone].  After hours and on weekends, please call the GI answering service at [phone] who will take a message and have the physician on call contact you.   Following lower endoscopy (colonoscopy or flexible sigmoidoscopy):  Excessive amounts of blood in the stool  Significant tenderness or worsening of abdominal pains  Swelling of the abdomen that is new, acute  Fever of 100F or higher    FOLLOW UP: If any biopsies were taken you will be contacted by phone or by letter within the next 1-3 weeks.  Call your gastroenterologist if you have not heard about the biopsies in 3 weeks.  Our staff will call the home number listed on your records the next business day following your procedure to check on you and address any questions or concerns that you may have at that time regarding the information given to you following your procedure. This is a courtesy call and so if there is no answer at the home number and we have not heard from you through the emergency physician on call, we will assume that you have returned to your regular daily activities without incident.  SIGNATURES/CONFIDENTIALITY: You and/or your care partner have signed paperwork which will be entered into your electronic medical record.  These signatures attest to the fact that that the information above on your After Visit Summary has been reviewed and is understood.  Full responsibility of the confidentiality   of this discharge information lies with you and/or your care-partner.  Normal colonoscopy-Repeat in 10 years-2025. 

## 2013-05-03 ENCOUNTER — Telehealth: Payer: Self-pay | Admitting: *Deleted

## 2013-05-03 NOTE — Telephone Encounter (Signed)
  Follow up Call-  Call back number 05/02/2013  Post procedure Call Back phone  # [phone]  Permission to leave phone message Yes     Patient questions:  Do you have a fever, pain , or abdominal swelling? no Pain Score  0 *  Have you tolerated food without any problems? yes  Have you been able to return to your normal activities? yes  Do you have any questions about your discharge instructions: Diet   no Medications  no Follow up visit  no  Do you have questions or concerns about your Care? no  Actions: * If pain score is 4 or above: No action needed, pain <4.

## 2015-05-07 LAB — HM MAMMOGRAPHY

## 2015-08-18 ENCOUNTER — Ambulatory Visit (INDEPENDENT_AMBULATORY_CARE_PROVIDER_SITE_OTHER): Payer: 59 | Admitting: Internal Medicine

## 2015-08-18 ENCOUNTER — Other Ambulatory Visit (INDEPENDENT_AMBULATORY_CARE_PROVIDER_SITE_OTHER): Payer: 59

## 2015-08-18 ENCOUNTER — Encounter: Payer: Self-pay | Admitting: Internal Medicine

## 2015-08-18 VITALS — BP 132/86 | HR 70 | Temp 97.6°F | Resp 16 | Ht 66.5 in | Wt 132.0 lb

## 2015-08-18 DIAGNOSIS — Z1159 Encounter for screening for other viral diseases: Secondary | ICD-10-CM

## 2015-08-18 DIAGNOSIS — Z23 Encounter for immunization: Secondary | ICD-10-CM

## 2015-08-18 DIAGNOSIS — I341 Nonrheumatic mitral (valve) prolapse: Secondary | ICD-10-CM

## 2015-08-18 DIAGNOSIS — T783XXA Angioneurotic edema, initial encounter: Secondary | ICD-10-CM

## 2015-08-18 DIAGNOSIS — E785 Hyperlipidemia, unspecified: Secondary | ICD-10-CM

## 2015-08-18 DIAGNOSIS — Z0000 Encounter for general adult medical examination without abnormal findings: Secondary | ICD-10-CM | POA: Diagnosis not present

## 2015-08-18 LAB — COMPREHENSIVE METABOLIC PANEL
ALBUMIN: 4.7 g/dL (ref 3.5–5.2)
ALT: 16 U/L (ref 0–35)
AST: 23 U/L (ref 0–37)
Alkaline Phosphatase: 56 U/L (ref 39–117)
BILIRUBIN TOTAL: 0.6 mg/dL (ref 0.2–1.2)
BUN: 19 mg/dL (ref 6–23)
CALCIUM: 9.9 mg/dL (ref 8.4–10.5)
CHLORIDE: 104 meq/L (ref 96–112)
CO2: 27 mEq/L (ref 19–32)
CREATININE: 0.7 mg/dL (ref 0.40–1.20)
GFR: 92.51 mL/min (ref >60.00)
Glucose, Bld: 100 mg/dL — ABNORMAL HIGH (ref 70–99)
Potassium: 4.1 mEq/L (ref 3.5–5.1)
Sodium: 141 mEq/L (ref 135–145)
Total Protein: 7.3 g/dL (ref 6.0–8.3)

## 2015-08-18 LAB — CBC WITH DIFFERENTIAL/PLATELET
BASOS ABS: 0 10*3/uL (ref 0.0–0.1)
BASOS PCT: 0.4 % (ref 0.0–3.0)
EOS ABS: 0.1 10*3/uL (ref 0.0–0.7)
Eosinophils Relative: 2.3 % (ref 0.0–5.0)
HEMATOCRIT: 44.4 % (ref 36.0–46.0)
HEMOGLOBIN: 15 g/dL (ref 12.0–15.0)
LYMPHS PCT: 26.8 % (ref 12.0–46.0)
Lymphs Abs: 1.2 10*3/uL (ref 0.7–4.0)
MCHC: 33.8 g/dL (ref 30.0–36.0)
MCV: 90.9 fl (ref 78.0–100.0)
MONOS PCT: 9.5 % (ref 3.0–12.0)
Monocytes Absolute: 0.4 10*3/uL (ref 0.1–1.0)
Neutro Abs: 2.8 10*3/uL (ref 1.4–7.7)
Neutrophils Relative %: 61 % (ref 43.0–77.0)
Platelets: 358 10*3/uL (ref 150.0–400.0)
RBC: 4.88 Mil/uL (ref 3.87–5.11)
RDW: 13.2 % (ref 11.5–15.5)
WBC: 4.6 10*3/uL (ref 4.0–10.5)

## 2015-08-18 LAB — LIPID PANEL
CHOLESTEROL: 252 mg/dL — AB (ref 0–200)
HDL: 68.7 mg/dL (ref >39.00)
LDL CALC: 169 mg/dL — AB (ref 0–99)
NonHDL: 183.38
Total CHOL/HDL Ratio: 4
Triglycerides: 70 mg/dL (ref 0.0–149.0)
VLDL: 14 mg/dL (ref 0.0–40.0)

## 2015-08-18 LAB — TSH: TSH: 1.7 u[IU]/mL (ref 0.35–4.50)

## 2015-08-18 MED ORDER — EPINEPHRINE 0.3 MG/0.3ML IJ SOAJ: 0.3000 mg | Freq: Once | INTRAMUSCULAR | Status: DC

## 2015-08-18 NOTE — Assessment & Plan Note (Signed)
Asymptomatic, no murmur

## 2015-08-18 NOTE — Assessment & Plan Note (Signed)
Low risk for heart disease Check lipid panel Continue regular exercise, healthy diet May try red yeast rice

## 2015-08-18 NOTE — Progress Notes (Signed)
Pre visit review using our clinic review tool, if applicable. No additional management support is needed unless otherwise documented below in the visit note. 

## 2015-08-18 NOTE — Progress Notes (Signed)
Subjective:    Patient ID: Jenny Haley, female    DOB: 11/25/1960, 55 y.o.   MRN: [id number]  HPI She is here to establish with a new pcp.  She is here for a physical exam.   She has no concerns.    Medications and allergies reviewed with patient and updated if appropriate.  Patient Active Problem List   Diagnosis Date Noted  . Hyperlipidemia 08/18/2015  . Allergic rhinitis 06/09/2011  . ARTHRITIS 09/14/2009  . MVP (mitral valve prolapse) 08/25/2009  . Angioedema 08/25/2009    Current Outpatient Prescriptions on File Prior to Visit  Medication Sig Dispense Refill  . Multiple Vitamin (MULTI VITAMIN DAILY PO) Take by mouth.     No current facility-administered medications on file prior to visit.    Past Medical History  Diagnosis Date  . H/O angioedema     neg rheum pos rf dr Dareen Pianoanderson 2011  . Mitral valve prolapse     echo years ago fainting in childhood   . Allergy   . Angioedema     eval by rheum and allergy dr Irena Cordsvan winkle now  . Osteopenia   . Chicken pox   . Infertility     Past Surgical History  Procedure Laterality Date  . Laparoscopic abdominal exploration  1992    Found minimal endometriosis    Social History   Social History  . Marital Status: Married    Spouse Name: N/A  . Number of Children: N/A  . Years of Education: N/A   Social History Main Topics  . Smoking status: Never Smoker   . Smokeless tobacco: Never Used  . Alcohol Use: No  . Drug Use: No  . Sexual Activity: Not Asked   Other Topics Concern  . 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 Hyperlipidemia Mother   . Hypertension Mother   .  Arthritis Mother     hands   . Endometrial cancer Mother [id number]    surgery, radiation  . Esophageal cancer Father     Esophageal with metastisis to the liver  . Polycystic kidney disease Brother     congental cr 5 range, s/p transplant  . Hypertension Brother   . Colon cancer Neg Hx   . Rectal cancer Neg Hx   . Stomach cancer Neg Hx     Review of Systems  Constitutional: Negative for fever, chills, appetite change, fatigue and unexpected weight change.  HENT: Negative for hearing loss.   Eyes: Negative for visual disturbance.  Respiratory: Negative for cough, shortness of breath and wheezing.   Cardiovascular: Negative for chest pain, palpitations and leg swelling.  Gastrointestinal: Negative for nausea, abdominal pain, diarrhea, constipation and blood in stool.       No gerd  Genitourinary: Negative for dysuria and hematuria.  Musculoskeletal: Positive for arthralgias (hands). Negative for myalgias and back pain.  Skin: Negative for color change and rash.  Neurological: Negative for dizziness, weakness, light-headedness, numbness and headaches.  Psychiatric/Behavioral:  Negative for sleep disturbance and dysphoric mood. The patient is not nervous/anxious.        Objective:   Filed Vitals:   08/18/15 1023  BP: 132/86  Pulse: 70  Temp: 97.6 F (36.4 C)  Resp: 16   Filed Weights   08/18/15 1023  Weight: 132 lb (59.875 kg)   Body mass index is 20.99 kg/(m^2).   Physical Exam Constitutional: She appears well-developed and well-nourished. No distress.  HENT:  Head: Normocephalic and atraumatic.  Right Ear: External ear normal. Normal ear canal and TM Left Ear: External ear normal.  Normal ear canal and TM Mouth/Throat: Oropharynx is clear and moist.  Eyes: Conjunctivae and EOM are normal.  Neck: Neck supple. No tracheal deviation present. No thyromegaly present.  No carotid bruit  Cardiovascular: Normal rate, regular rhythm, mid systolic click of MVP    No murmur heard.   No edema. Pulmonary/Chest: Effort normal and breath sounds normal. No respiratory distress. She has no wheezes. She has no rales.  Breast: deferred to Gyn Abdominal: Soft. She exhibits no distension. There is no tenderness.  Lymphadenopathy: She has no cervical adenopathy.  Skin: Skin is warm and dry. She is not diaphoretic.  Psychiatric: She has a normal mood and affect. Her behavior is normal.      Assessment & Plan:   Physical exam: Screening blood work ordered Immunizations tdap today, other immunizations up to date Colonoscopy Up to date  Mammogram Up to date  Gyn  Up to date  Dexa  Up to date  Eye exams - last exam two years ago EKG - not indicated, had one done in past - normal, consider repeat next year Exercise - regular Weight - normal BMI - has lost a few lbs in the past few months Skin  - sees derm annually Substance abuse - none   See Problem List for Assessment and Plan of chronic medical problems.  Follow up annually

## 2015-08-18 NOTE — Patient Instructions (Addendum)
Red yeast rice - this is a supplement that may help lower your cholesterol naturally.  Test(s) ordered today. Your results will be released to [place] (or called to you) after review, usually within 72hours after test completion. If any changes need to be made, you will be notified at that same time.  All other Health Maintenance issues reviewed.   All recommended immunizations and age-appropriate screenings are up-to-date or discussed.  Tetanus vaccine administered today.   Medications reviewed and updated.  No changes recommended at this time.   Please followup in 1 year for a physical exam  Health Maintenance, Female Adopting a healthy lifestyle and getting preventive care can go a long way to promote health and wellness. Talk with your health care provider about what schedule of regular examinations is right for you. This is a good chance for you to check in with your provider about disease prevention and staying healthy. In between checkups, there are plenty of things you can do on your own. Experts have done a lot of research about which lifestyle changes and preventive measures are most likely to keep you healthy. Ask your health care provider for more information. WEIGHT AND DIET  Eat a healthy diet  Be sure to include plenty of vegetables, fruits, low-fat dairy products, and lean protein.  Do not eat a lot of foods high in solid fats, added sugars, or salt.  Get regular exercise. This is one of the most important things you can do for your health.  Most adults should exercise for at least 150 minutes each week. The exercise should increase your heart rate and make you sweat (moderate-intensity exercise).  Most adults should also do strengthening exercises at least twice a week. This is in addition to the moderate-intensity exercise.  Maintain a healthy weight  Body mass index (BMI) is a measurement that can be used to identify possible weight problems. It estimates body fat  based on height and weight. Your health care provider can help determine your BMI and help you achieve or maintain a healthy weight.  For females 12 years of age and older:   A BMI below 18.5 is considered underweight.  A BMI of 18.5 to 24.9 is normal.  A BMI of 25 to 29.9 is considered overweight.  A BMI of 30 and above is considered obese.  Watch levels of cholesterol and blood lipids  You should start having your blood tested for lipids and cholesterol at 55 years of age, then have this test every 5 years.  You may need to have your cholesterol levels checked more often if:  Your lipid or cholesterol levels are high.  You are older than 55 years of age.  You are at high risk for heart disease.  CANCER SCREENING   Lung Cancer  Lung cancer screening is recommended for adults 64-52 years old who are at high risk for lung cancer because of a history of smoking.  A yearly low-dose CT scan of the lungs is recommended for people who:  Currently smoke.  Have quit within the past 15 years.  Have at least a 30-pack-year history of smoking. A pack year is smoking an average of one pack of cigarettes a day for 1 year.  Yearly screening should continue until it has been 15 years since you quit.  Yearly screening should stop if you develop a health problem that would prevent you from having lung cancer treatment.  Breast Cancer  Practice breast self-awareness. This means understanding how  your breasts normally appear and feel.  It also means doing regular breast self-exams. Let your health care provider know about any changes, no matter how small.  If you are in your 20s or 30s, you should have a clinical breast exam (CBE) by a health care provider every 1-3 years as part of a regular health exam.  If you are 26 or older, have a CBE every year. Also consider having a breast X-ray (mammogram) every year.  If you have a family history of breast cancer, talk to your health  care provider about genetic screening.  If you are at high risk for breast cancer, talk to your health care provider about having an MRI and a mammogram every year.  Breast cancer gene (BRCA) assessment is recommended for women who have family members with BRCA-related cancers. BRCA-related cancers include:  Breast.  Ovarian.  Tubal.  Peritoneal cancers.  Results of the assessment will determine the need for genetic counseling and BRCA1 and BRCA2 testing. Cervical Cancer Your health care provider may recommend that you be screened regularly for cancer of the pelvic organs (ovaries, uterus, and vagina). This screening involves a pelvic examination, including checking for microscopic changes to the surface of your cervix (Pap test). You may be encouraged to have this screening done every 3 years, beginning at age 33.  For women ages 74-65, health care providers may recommend pelvic exams and Pap testing every 3 years, or they may recommend the Pap and pelvic exam, combined with testing for human papilloma virus (HPV), every 5 years. Some types of HPV increase your risk of cervical cancer. Testing for HPV may also be done on women of any age with unclear Pap test results.  Other health care providers may not recommend any screening for nonpregnant women who are considered low risk for pelvic cancer and who do not have symptoms. Ask your health care provider if a screening pelvic exam is right for you.  If you have had past treatment for cervical cancer or a condition that could lead to cancer, you need Pap tests and screening for cancer for at least 20 years after your treatment. If Pap tests have been discontinued, your risk factors (such as having a new sexual partner) need to be reassessed to determine if screening should resume. Some women have medical problems that increase the chance of getting cervical cancer. In these cases, your health care provider may recommend more frequent screening  and Pap tests. Colorectal Cancer  This type of cancer can be detected and often prevented.  Routine colorectal cancer screening usually begins at 55 years of age and continues through 55 years of age.  Your health care provider may recommend screening at an earlier age if you have risk factors for colon cancer.  Your health care provider may also recommend using home test kits to check for hidden blood in the stool.  A small camera at the end of a tube can be used to examine your colon directly (sigmoidoscopy or colonoscopy). This is done to check for the earliest forms of colorectal cancer.  Routine screening usually begins at age 73.  Direct examination of the colon should be repeated every 5-10 years through 55 years of age. However, you may need to be screened more often if early forms of precancerous polyps or small growths are found. Skin Cancer  Check your skin from head to toe regularly.  Tell your health care provider about any new moles or changes in moles, especially  if there is a change in a mole's shape or color.  Also tell your health care provider if you have a mole that is larger than the size of a pencil eraser.  Always use sunscreen. Apply sunscreen liberally and repeatedly throughout the day.  Protect yourself by wearing long sleeves, pants, a wide-brimmed hat, and sunglasses whenever you are outside. HEART DISEASE, DIABETES, AND HIGH BLOOD PRESSURE   High blood pressure causes heart disease and increases the risk of stroke. High blood pressure is more likely to develop in:  People who have blood pressure in the high end of the normal range (130-139/85-89 mm Hg).  People who are overweight or obese.  People who are African American.  If you are 64-15 years of age, have your blood pressure checked every 3-5 years. If you are 64 years of age or older, have your blood pressure checked every year. You should have your blood pressure measured twice--once when you  are at a hospital or clinic, and once when you are not at a hospital or clinic. Record the average of the two measurements. To check your blood pressure when you are not at a hospital or clinic, you can use:  An automated blood pressure machine at a pharmacy.  A home blood pressure monitor.  If you are between 4 years and 33 years old, ask your health care provider if you should take aspirin to prevent strokes.  Have regular diabetes screenings. This involves taking a blood sample to check your fasting blood sugar level.  If you are at a normal weight and have a low risk for diabetes, have this test once every three years after 55 years of age.  If you are overweight and have a high risk for diabetes, consider being tested at a younger age or more often. PREVENTING INFECTION  Hepatitis B  If you have a higher risk for hepatitis B, you should be screened for this virus. You are considered at high risk for hepatitis B if:  You were born in a country where hepatitis B is common. Ask your health care provider which countries are considered high risk.  Your parents were born in a high-risk country, and you have not been immunized against hepatitis B (hepatitis B vaccine).  You have HIV or AIDS.  You use needles to inject street drugs.  You live with someone who has hepatitis B.  You have had sex with someone who has hepatitis B.  You get hemodialysis treatment.  You take certain medicines for conditions, including cancer, organ transplantation, and autoimmune conditions. Hepatitis C  Blood testing is recommended for:  Everyone born from [birth year] through 1965.  Anyone with known risk factors for hepatitis C. Sexually transmitted infections (STIs)  You should be screened for sexually transmitted infections (STIs) including gonorrhea and chlamydia if:  You are sexually active and are younger than 55 years of age.  You are older than 55 years of age and your health care provider  tells you that you are at risk for this type of infection.  Your sexual activity has changed since you were last screened and you are at an increased risk for chlamydia or gonorrhea. Ask your health care provider if you are at risk.  If you do not have HIV, but are at risk, it may be recommended that you take a prescription medicine daily to prevent HIV infection. This is called pre-exposure prophylaxis (PrEP). You are considered at risk if:  You are sexually active and  do not regularly use condoms or know the HIV status of your partner(s).  You take drugs by injection.  You are sexually active with a partner who has HIV. Talk with your health care provider about whether you are at high risk of being infected with HIV. If you choose to begin PrEP, you should first be tested for HIV. You should then be tested every 3 months for as long as you are taking PrEP.  PREGNANCY   If you are premenopausal and you may become pregnant, ask your health care provider about preconception counseling.  If you may become pregnant, take 400 to 800 micrograms (mcg) of folic acid every day.  If you want to prevent pregnancy, talk to your health care provider about birth control (contraception). OSTEOPOROSIS AND MENOPAUSE   Osteoporosis is a disease in which the bones lose minerals and strength with aging. This can result in serious bone fractures. Your risk for osteoporosis can be identified using a bone density scan.  If you are 35 years of age or older, or if you are at risk for osteoporosis and fractures, ask your health care provider if you should be screened.  Ask your health care provider whether you should take a calcium or vitamin D supplement to lower your risk for osteoporosis.  Menopause may have certain physical symptoms and risks.  Hormone replacement therapy may reduce some of these symptoms and risks. Talk to your health care provider about whether hormone replacement therapy is right for  you.  HOME CARE INSTRUCTIONS   Schedule regular health, dental, and eye exams.  Stay current with your immunizations.   Do not use any tobacco products including cigarettes, chewing tobacco, or electronic cigarettes.  If you are pregnant, do not drink alcohol.  If you are breastfeeding, limit how much and how often you drink alcohol.  Limit alcohol intake to no more than 1 drink per day for nonpregnant women. One drink equals 12 ounces of beer, 5 ounces of wine, or 1 ounces of hard liquor.  Do not use street drugs.  Do not share needles.  Ask your health care provider for help if you need support or information about quitting drugs.  Tell your health care provider if you often feel depressed.  Tell your health care provider if you have ever been abused or do not feel safe at home.   This information is not intended to replace advice given to you by your health care provider. Make sure you discuss any questions you have with your health care provider.   Document Released: 10/11/2010 Document Revised: 04/18/2014 Document Reviewed: 02/27/2013 Elsevier Interactive Patient Education [employer].

## 2015-08-19 LAB — HEPATITIS C ANTIBODY: HCV Ab: NEGATIVE

## 2015-08-22 ENCOUNTER — Encounter: Payer: Self-pay | Admitting: Internal Medicine

## 2015-09-24 ENCOUNTER — Encounter: Payer: Self-pay | Admitting: Internal Medicine

## 2015-09-26 ENCOUNTER — Encounter: Payer: Self-pay | Admitting: Internal Medicine

## 2015-09-26 DIAGNOSIS — M8580 Other specified disorders of bone density and structure, unspecified site: Secondary | ICD-10-CM | POA: Insufficient documentation

## 2015-09-26 DIAGNOSIS — M810 Age-related osteoporosis without current pathological fracture: Secondary | ICD-10-CM | POA: Insufficient documentation

## 2016-09-21 ENCOUNTER — Ambulatory Visit (INDEPENDENT_AMBULATORY_CARE_PROVIDER_SITE_OTHER): Payer: BLUE CROSS/BLUE SHIELD | Admitting: Internal Medicine

## 2016-09-21 ENCOUNTER — Encounter: Payer: Self-pay | Admitting: Internal Medicine

## 2016-09-21 ENCOUNTER — Other Ambulatory Visit (INDEPENDENT_AMBULATORY_CARE_PROVIDER_SITE_OTHER): Payer: BLUE CROSS/BLUE SHIELD

## 2016-09-21 VITALS — BP 130/86 | HR 85 | Temp 98.3°F | Resp 16 | Ht 67.0 in | Wt 131.0 lb

## 2016-09-21 DIAGNOSIS — Z0000 Encounter for general adult medical examination without abnormal findings: Secondary | ICD-10-CM | POA: Diagnosis not present

## 2016-09-21 DIAGNOSIS — E7800 Pure hypercholesterolemia, unspecified: Secondary | ICD-10-CM

## 2016-09-21 DIAGNOSIS — R739 Hyperglycemia, unspecified: Secondary | ICD-10-CM | POA: Diagnosis not present

## 2016-09-21 DIAGNOSIS — M85851 Other specified disorders of bone density and structure, right thigh: Secondary | ICD-10-CM | POA: Diagnosis not present

## 2016-09-21 DIAGNOSIS — T783XXA Angioneurotic edema, initial encounter: Secondary | ICD-10-CM | POA: Diagnosis not present

## 2016-09-21 LAB — COMPREHENSIVE METABOLIC PANEL
ALBUMIN: 4.9 g/dL (ref 3.5–5.2)
ALK PHOS: 65 U/L (ref 39–117)
ALT: 18 U/L (ref 0–35)
AST: 21 U/L (ref 0–37)
BUN: 15 mg/dL (ref 6–23)
CALCIUM: 10.2 mg/dL (ref 8.4–10.5)
CO2: 28 mEq/L (ref 19–32)
Chloride: 102 mEq/L (ref 96–112)
Creatinine, Ser: 0.75 mg/dL (ref 0.40–1.20)
GFR: 85.08 mL/min (ref >60.00)
Glucose, Bld: 96 mg/dL (ref 70–99)
POTASSIUM: 3.9 meq/L (ref 3.5–5.1)
Sodium: 138 mEq/L (ref 135–145)
TOTAL PROTEIN: 7.4 g/dL (ref 6.0–8.3)
Total Bilirubin: 0.6 mg/dL (ref 0.2–1.2)

## 2016-09-21 LAB — CBC WITH DIFFERENTIAL/PLATELET
Basophils Absolute: 0 10*3/uL (ref 0.0–0.1)
Basophils Relative: 0.4 % (ref 0.0–3.0)
EOS PCT: 1.8 % (ref 0.0–5.0)
Eosinophils Absolute: 0.1 10*3/uL (ref 0.0–0.7)
HEMATOCRIT: 45.4 % (ref 36.0–46.0)
HEMOGLOBIN: 15.1 g/dL — AB (ref 12.0–15.0)
LYMPHS ABS: 1.4 10*3/uL (ref 0.7–4.0)
Lymphocytes Relative: 25.4 % (ref 12.0–46.0)
MCHC: 33.3 g/dL (ref 30.0–36.0)
MCV: 93 fl (ref 78.0–100.0)
MONOS PCT: 9 % (ref 3.0–12.0)
Monocytes Absolute: 0.5 10*3/uL (ref 0.1–1.0)
Neutro Abs: 3.4 10*3/uL (ref 1.4–7.7)
Neutrophils Relative %: 63.4 % (ref 43.0–77.0)
Platelets: 351 10*3/uL (ref 150.0–400.0)
RBC: 4.88 Mil/uL (ref 3.87–5.11)
RDW: 13.4 % (ref 11.5–15.5)
WBC: 5.4 10*3/uL (ref 4.0–10.5)

## 2016-09-21 LAB — HEMOGLOBIN A1C: HEMOGLOBIN A1C: 5.4 % (ref 4.6–6.5)

## 2016-09-21 LAB — LIPID PANEL
CHOLESTEROL: 240 mg/dL — AB (ref 0–200)
HDL: 64.5 mg/dL (ref >39.00)
LDL Cholesterol: 153 mg/dL — ABNORMAL HIGH (ref 0–99)
NonHDL: 175.26
Total CHOL/HDL Ratio: 4
Triglycerides: 111 mg/dL (ref 0.0–149.0)
VLDL: 22.2 mg/dL (ref 0.0–40.0)

## 2016-09-21 LAB — VITAMIN D 25 HYDROXY (VIT D DEFICIENCY, FRACTURES): VITD: 36.49 ng/mL (ref 30.00–100.00)

## 2016-09-21 LAB — TSH: TSH: 2.31 u[IU]/mL (ref 0.35–4.50)

## 2016-09-21 MED ORDER — EPINEPHRINE 0.3 MG/0.3ML IJ SOAJ: 0.3000 mg | Freq: Once | INTRAMUSCULAR | 5 refills | Status: AC

## 2016-09-21 MED ORDER — EPINEPHRINE 0.3 MG/0.3ML IJ SOAJ: 0.3000 mg | Freq: Once | INTRAMUSCULAR | Status: DC

## 2016-09-21 NOTE — Assessment & Plan Note (Signed)
Taking red yeast rice Recheck lipids

## 2016-09-21 NOTE — Patient Instructions (Addendum)
Dr Edwinna Areola or Dr Sumner Boast - only GYN, [facility] health care - on [facility]   Test(s) ordered today. Your results will be released to [facility] (or called to you) after review, usually within 72hours after test completion. If any changes need to be made, you will be notified at that same time.  All other Health Maintenance issues reviewed.   All recommended immunizations and age-appropriate screenings are up-to-date or discussed.  No immunizations administered today.   Medications reviewed and updated.  No changes recommended at this time.  Your prescription(s) have been submitted to your pharmacy. Please take as directed and contact our office if you believe you are having problem(s) with the medication(s).    Please followup in one year   Health Maintenance, Female Adopting a healthy lifestyle and getting preventive care can go a long way to promote health and wellness. Talk with your health care provider about what schedule of regular examinations is right for you. This is a good chance for you to check in with your provider about disease prevention and staying healthy. In between checkups, there are plenty of things you can do on your own. Experts have done a lot of research about which lifestyle changes and preventive measures are most likely to keep you healthy. Ask your health care provider for more information. Weight and diet Eat a healthy diet  Be sure to include plenty of vegetables, fruits, low-fat dairy products, and lean protein.  Do not eat a lot of foods high in solid fats, added sugars, or salt.  Get regular exercise. This is one of the most important things you can do for your health. ? Most adults should exercise for at least 150 minutes each week. The exercise should increase your heart rate and make you sweat (moderate-intensity exercise). ? Most adults should also do strengthening exercises at least twice a week. This is in addition to the  moderate-intensity exercise.  Maintain a healthy weight  Body mass index (BMI) is a measurement that can be used to identify possible weight problems. It estimates body fat based on height and weight. Your health care provider can help determine your BMI and help you achieve or maintain a healthy weight.  For females 34 years of age and older: ? A BMI below 18.5 is considered underweight. ? A BMI of 18.5 to 24.9 is normal. ? A BMI of 25 to 29.9 is considered overweight. ? A BMI of 30 and above is considered obese.  Watch levels of cholesterol and blood lipids  You should start having your blood tested for lipids and cholesterol at 56 years of age, then have this test every 5 years.  You may need to have your cholesterol levels checked more often if: ? Your lipid or cholesterol levels are high. ? You are older than 56 years of age. ? You are at high risk for heart disease.  Cancer screening Lung Cancer  Lung cancer screening is recommended for adults 15-51 years old who are at high risk for lung cancer because of a history of smoking.  A yearly low-dose CT scan of the lungs is recommended for people who: ? Currently smoke. ? Have quit within the past 15 years. ? Have at least a 30-pack-year history of smoking. A pack year is smoking an average of one pack of cigarettes a day for 1 year.  Yearly screening should continue until it has been 15 years since you quit.  Yearly screening should stop if  screening should stop if you develop a health problem that would prevent you from having lung cancer treatment.  Breast Cancer  Practice breast self-awareness. This means understanding how your breasts normally appear and feel.  It also means doing regular breast self-exams. Let your health care provider know about any changes, no matter how small.  If you are in your 20s or 30s, you should have a clinical breast exam (CBE) by a health care provider every 1-3 years as part of a regular health exam.  If you  are 40 or older, have a CBE every year. Also consider having a breast X-ray (mammogram) every year.  If you have a family history of breast cancer, talk to your health care provider about genetic screening.  If you are at high risk for breast cancer, talk to your health care provider about having an MRI and a mammogram every year.  Breast cancer gene (BRCA) assessment is recommended for women who have family members with BRCA-related cancers. BRCA-related cancers include: ? Breast. ? Ovarian. ? Tubal. ? Peritoneal cancers.  Results of the assessment will determine the need for genetic counseling and BRCA1 and BRCA2 testing.  Cervical Cancer Your health care provider may recommend that you be screened regularly for cancer of the pelvic organs (ovaries, uterus, and vagina). This screening involves a pelvic examination, including checking for microscopic changes to the surface of your cervix (Pap test). You may be encouraged to have this screening done every 3 years, beginning at age 21.  For women ages 30-65, health care providers may recommend pelvic exams and Pap testing every 3 years, or they may recommend the Pap and pelvic exam, combined with testing for human papilloma virus (HPV), every 5 years. Some types of HPV increase your risk of cervical cancer. Testing for HPV may also be done on women of any age with unclear Pap test results.  Other health care providers may not recommend any screening for nonpregnant women who are considered low risk for pelvic cancer and who do not have symptoms. Ask your health care provider if a screening pelvic exam is right for you.  If you have had past treatment for cervical cancer or a condition that could lead to cancer, you need Pap tests and screening for cancer for at least 20 years after your treatment. If Pap tests have been discontinued, your risk factors (such as having a new sexual partner) need to be reassessed to determine if screening should  resume. Some women have medical problems that increase the chance of getting cervical cancer. In these cases, your health care provider may recommend more frequent screening and Pap tests.  Colorectal Cancer  This type of cancer can be detected and often prevented.  Routine colorectal cancer screening usually begins at 56 years of age and continues through 56 years of age.  Your health care provider may recommend screening at an earlier age if you have risk factors for colon cancer.  Your health care provider may also recommend using home test kits to check for hidden blood in the stool.  A small camera at the end of a tube can be used to examine your colon directly (sigmoidoscopy or colonoscopy). This is done to check for the earliest forms of colorectal cancer.  Routine screening usually begins at age 50.  Direct examination of the colon should be repeated every 5-10 years through 56 years of age. However, you may need to be screened more often if early forms of precancerous polyps   or small growths are found.  Skin Cancer  Check your skin from head to toe regularly.  Tell your health care provider about any new moles or changes in moles, especially if there is a change in a mole's shape or color.  Also tell your health care provider if you have a mole that is larger than the size of a pencil eraser.  Always use sunscreen. Apply sunscreen liberally and repeatedly throughout the day.  Protect yourself by wearing long sleeves, pants, a wide-brimmed hat, and sunglasses whenever you are outside.  Heart disease, diabetes, and high blood pressure  High blood pressure causes heart disease and increases the risk of stroke. High blood pressure is more likely to develop in: ? People who have blood pressure in the high end of the normal range (130-139/85-89 mm Hg). ? People who are overweight or obese. ? People who are African American.  If you are 18-39 years of age, have your blood  pressure checked every 3-5 years. If you are 40 years of age or older, have your blood pressure checked every year. You should have your blood pressure measured twice-once when you are at a hospital or clinic, and once when you are not at a hospital or clinic. Record the average of the two measurements. To check your blood pressure when you are not at a hospital or clinic, you can use: ? An automated blood pressure machine at a pharmacy. ? A home blood pressure monitor.  If you are between 55 years and 79 years old, ask your health care provider if you should take aspirin to prevent strokes.  Have regular diabetes screenings. This involves taking a blood sample to check your fasting blood sugar level. ? If you are at a normal weight and have a low risk for diabetes, have this test once every three years after 56 years of age. ? If you are overweight and have a high risk for diabetes, consider being tested at a younger age or more often. Preventing infection Hepatitis B  If you have a higher risk for hepatitis B, you should be screened for this virus. You are considered at high risk for hepatitis B if: ? You were born in a country where hepatitis B is common. Ask your health care provider which countries are considered high risk. ? Your parents were born in a high-risk country, and you have not been immunized against hepatitis B (hepatitis B vaccine). ? You have HIV or AIDS. ? You use needles to inject street drugs. ? You live with someone who has hepatitis B. ? You have had sex with someone who has hepatitis B. ? You get hemodialysis treatment. ? You take certain medicines for conditions, including cancer, organ transplantation, and autoimmune conditions.  Hepatitis C  Blood testing is recommended for: ? Everyone born from 1945 through 1965. ? Anyone with known risk factors for hepatitis C.  Sexually transmitted infections (STIs)  You should be screened for sexually transmitted  infections (STIs) including gonorrhea and chlamydia if: ? You are sexually active and are younger than 56 years of age. ? You are older than 56 years of age and your health care provider tells you that you are at risk for this type of infection. ? Your sexual activity has changed since you were last screened and you are at an increased risk for chlamydia or gonorrhea. Ask your health care provider if you are at risk.  If you do not have HIV, but are at risk,   that you take a prescription medicine daily to prevent HIV infection. This is called pre-exposure prophylaxis (PrEP). You are considered at risk if: ? You are sexually active and do not regularly use condoms or know the HIV status of your partner(s). ? You take drugs by injection. ? You are sexually active with a partner who has HIV.  Talk with your health care provider about whether you are at high risk of being infected with HIV. If you choose to begin PrEP, you should first be tested for HIV. You should then be tested every 3 months for as long as you are taking PrEP. Pregnancy  If you are premenopausal and you may become pregnant, ask your health care provider about preconception counseling.  If you may become pregnant, take 400 to 800 micrograms (mcg) of folic acid every day.  If you want to prevent pregnancy, talk to your health care provider about birth control (contraception). Osteoporosis and menopause  Osteoporosis is a disease in which the bones lose minerals and strength with aging. This can result in serious bone fractures. Your risk for osteoporosis can be identified using a bone density scan.  If you are 33 years of age or older, or if you are at risk for osteoporosis and fractures, ask your health care provider if you should be screened.  Ask your health care provider whether you should take a calcium or vitamin D supplement to lower your risk for osteoporosis.  Menopause may have certain physical  symptoms and risks.  Hormone replacement therapy may reduce some of these symptoms and risks. Talk to your health care provider about whether hormone replacement therapy is right for you. Follow these instructions at home:  Schedule regular health, dental, and eye exams.  Stay current with your immunizations.  Do not use any tobacco products including cigarettes, chewing tobacco, or electronic cigarettes.  If you are pregnant, do not drink alcohol.  If you are breastfeeding, limit how much and how often you drink alcohol.  Limit alcohol intake to no more than 1 drink per day for nonpregnant women. One drink equals 12 ounces of beer, 5 ounces of wine, or 1 ounces of hard liquor.  Do not use street drugs.  Do not share needles.  Ask your health care provider for help if you need support or information about quitting drugs.  Tell your health care provider if you often feel depressed.  Tell your health care provider if you have ever been abused or do not feel safe at home. This information is not intended to replace advice given to you by your health care provider. Make sure you discuss any questions you have with your health care provider. Document Released: 10/11/2010 Document Revised: 09/03/2015 Document Reviewed: 12/30/2014 Elsevier Interactive Patient Education  [employer].

## 2016-09-21 NOTE — Assessment & Plan Note (Signed)
Gyn - Dr Langston MaskerMorris Will have dexa this summer Walking regularly Taking an MVI Will check D level

## 2016-09-21 NOTE — Assessment & Plan Note (Signed)
Check a1c 

## 2016-09-21 NOTE — Progress Notes (Signed)
Subjective:    Patient ID: Jenny Haley, female    DOB: 03/18/1961, 56 y.o.   MRN: [id number]  HPI She is here for a physical exam.   She has no concerns.  Her mom's cancer has returned and just finished treatment.  She is her main support.    She denies other changes in her family history of her history.   Medications and allergies reviewed with patient and updated if appropriate.  Patient Active Problem List   Diagnosis Date Noted  . Hyperglycemia 09/21/2016  . Osteopenia 09/26/2015  . Hyperlipidemia 08/18/2015  . Allergic rhinitis 06/09/2011  . ARTHRITIS 09/14/2009  . MVP (mitral valve prolapse) 08/25/2009  . Angioedema 08/25/2009    Current Outpatient Prescriptions on File Prior to Visit  Medication Sig Dispense Refill  . Multiple Vitamin (MULTI VITAMIN DAILY PO) Take by mouth.     No current facility-administered medications on file prior to visit.     Past Medical History:  Diagnosis Date  . Allergy   . Angioedema    eval by rheum and allergy dr Irena Cordsvan winkle now  . Chicken pox   . H/O angioedema    neg rheum pos rf dr Dareen Pianoanderson 2011  . Infertility   . Mitral valve prolapse    echo years ago fainting in childhood   . Osteopenia     Past Surgical History:  Procedure Laterality Date  . LAPAROSCOPIC ABDOMINAL EXPLORATION  1992   Found minimal endometriosis    Social History   Social History  . Marital status: Married    Spouse name: N/A  . Number of children: N/A  . Years of education: N/A   Social History Main Topics  . Smoking status: Never Smoker  . Smokeless tobacco: Never Used  . Alcohol use No  . Drug use: No  . Sexual activity: Not Asked   Other Topics Concern  . None   Social History Narrative   Sleeps about 6-7 hours of sleep per night (thanks to menopause)   2 people living in the home   Has a small dog in the home    p0 g0   No tobacco ets    Husband IT  [employer]ific [employer]    College degree  Homemaker  Caring for parents    Neg  tad      Exercises - walks 4 miles, bikes 10 miles    Family History  Problem Relation Age of Onset  . Hyperlipidemia Mother   . Hypertension Mother   . Arthritis Mother        hands   . Endometrial cancer Mother [id number]       surgery, radiation  . Esophageal cancer Father        Esophageal with metastisis to the liver  . Polycystic kidney disease Brother        congental cr 5 range, s/p transplant  . Hypertension Brother   . Colon cancer Neg Hx   . Rectal cancer Neg Hx   . Stomach cancer Neg Hx     Review of Systems  Constitutional: Negative for appetite change, chills, fatigue, fever and unexpected weight change.  Eyes: Negative for visual disturbance.  Respiratory: Negative for cough, shortness of breath and wheezing.   Cardiovascular: Negative for chest pain, palpitations and leg swelling.  Gastrointestinal: Negative for abdominal pain, blood in stool, constipation, diarrhea and nausea.       No gerd  Genitourinary: Negative for dysuria and hematuria.  Musculoskeletal: Negative for  arthralgias, back pain and myalgias.  Skin: Negative for color change and rash.  Neurological: Negative for dizziness, light-headedness and headaches.  Psychiatric/Behavioral: Positive for sleep disturbance (menopause related). Negative for dysphoric mood. The patient is not nervous/anxious.        Objective:   Vitals:   09/21/16 1401  BP: 130/86  Pulse: 85  Resp: 16  Temp: 98.3 F (36.8 C)   Filed Weights   09/21/16 1401  Weight: 131 lb (59.4 kg)   Body mass index is 20.52 kg/m.  Wt Readings from Last 3 Encounters:  09/21/16 131 lb (59.4 kg)  08/18/15 132 lb (59.9 kg)  05/02/13 129 lb (58.5 kg)     Physical Exam Constitutional: She appears well-developed and well-nourished. No distress.  HENT:  Head: Normocephalic and atraumatic.  Right Ear: External ear normal. Normal ear canal and TM Left Ear: External ear normal.  Normal ear canal and TM Mouth/Throat: Oropharynx is  clear and moist.  Eyes: Conjunctivae and EOM are normal.  Neck: Neck supple. No tracheal deviation present. No thyromegaly present.  No carotid bruit  Cardiovascular: Normal rate, regular rhythm and normal heart sounds.   No murmur heard.  No edema. Pulmonary/Chest: Effort normal and breath sounds normal. No respiratory distress. She has no wheezes. She has no rales.  Breast: deferred to Gyn Abdominal: Soft. She exhibits no distension. There is no tenderness.  Lymphadenopathy: She has no cervical adenopathy.  Skin: Skin is warm and dry. She is not diaphoretic.  Psychiatric: She has a normal mood and affect. Her behavior is normal.         Assessment & Plan:   Physical exam: Screening blood work   ordered Immunizations  Up to date  Colonoscopy   Up to date  Mammogram   Up to date  Gyn  Up to date  Dexa  - dexa 2014 showed osteopenia - schedule at gyn's office Eye exams  Up to date  Exercise  Walking 2 mi/ day, doing yoga Weight  Normal BMI Skin   No concerns, sees derm annually  Substance abuse  none  See Problem List for Assessment and Plan of chronic medical problems.

## 2016-09-21 NOTE — Assessment & Plan Note (Signed)
Epipen renewed

## 2016-09-22 ENCOUNTER — Encounter: Payer: Self-pay | Admitting: Internal Medicine

## 2016-10-14 ENCOUNTER — Telehealth: Payer: Self-pay | Admitting: *Deleted

## 2016-10-14 MED ORDER — EPINEPHRINE 0.3 MG/0.3ML IJ SOAJ: 0.3000 mg | Freq: Once | INTRAMUSCULAR | 3 refills | Status: AC

## 2016-10-14 NOTE — Telephone Encounter (Signed)
sent 

## 2016-10-14 NOTE — Telephone Encounter (Signed)
Rec'd call pt states she saw MD couple weeks ago asked for Epi Pen, and she was going to send it to [place]. Pharmacy never received, but due to insurance must get rx from [facility]. Requesting MD to send rx to [facility]...Raechel Chute/lmb

## 2016-12-01 DIAGNOSIS — Z6822 Body mass index (BMI) 22.0-22.9, adult: Secondary | ICD-10-CM | POA: Diagnosis not present

## 2016-12-01 DIAGNOSIS — Z01419 Encounter for gynecological examination (general) (routine) without abnormal findings: Secondary | ICD-10-CM | POA: Diagnosis not present

## 2016-12-01 DIAGNOSIS — Z13820 Encounter for screening for osteoporosis: Secondary | ICD-10-CM | POA: Diagnosis not present

## 2016-12-01 DIAGNOSIS — Z1231 Encounter for screening mammogram for malignant neoplasm of breast: Secondary | ICD-10-CM | POA: Diagnosis not present

## 2017-02-09 DIAGNOSIS — D1800 Hemangioma unspecified site: Secondary | ICD-10-CM | POA: Diagnosis not present

## 2017-02-09 DIAGNOSIS — D225 Melanocytic nevi of trunk: Secondary | ICD-10-CM | POA: Diagnosis not present

## 2017-02-09 DIAGNOSIS — Z808 Family history of malignant neoplasm of other organs or systems: Secondary | ICD-10-CM | POA: Diagnosis not present

## 2017-02-09 DIAGNOSIS — L814 Other melanin hyperpigmentation: Secondary | ICD-10-CM | POA: Diagnosis not present

## 2017-09-22 ENCOUNTER — Encounter: Payer: BLUE CROSS/BLUE SHIELD | Admitting: Internal Medicine

## 2017-11-28 NOTE — Patient Instructions (Addendum)
Test(s) ordered today. Your results will be released to Jenny Haley (or called to you) after review, usually within 72hours after test completion. If any changes need to be made, you will be notified at that same time.  All other Health Maintenance issues reviewed.   All recommended immunizations and age-appropriate screenings are up-to-date or discussed.  No immunizations administered today.   Medications reviewed and updated.  No changes recommended at this time.  Your prescription(s) have been submitted to your pharmacy. Please take as directed and contact our office if you believe you are having problem(s) with the medication(s).   Please followup in one year   Health Maintenance, Female Adopting a healthy lifestyle and getting preventive care can go a long way to promote health and wellness. Talk with your health care provider about what schedule of regular examinations is right for you. This is a good chance for you to check in with your provider about disease prevention and staying healthy. In between checkups, there are plenty of things you can do on your own. Experts have done a lot of research about which lifestyle changes and preventive measures are most likely to keep you healthy. Ask your health care provider for more information. Weight and diet Eat a healthy diet  Be sure to include plenty of vegetables, fruits, low-fat dairy products, and lean protein.  Do not eat a lot of foods high in solid fats, added sugars, or salt.  Get regular exercise. This is one of the most important things you can do for your health. ? Most adults should exercise for at least 150 minutes each week. The exercise should increase your heart rate and make you sweat (moderate-intensity exercise). ? Most adults should also do strengthening exercises at least twice a week. This is in addition to the moderate-intensity exercise.  Maintain a healthy weight  Body mass index (BMI) is a measurement that can  be used to identify possible weight problems. It estimates body fat based on height and weight. Your health care provider can help determine your BMI and help you achieve or maintain a healthy weight.  For females 37 years of age and older: ? A BMI below 18.5 is considered underweight. ? A BMI of 18.5 to 24.9 is normal. ? A BMI of 25 to 29.9 is considered overweight. ? A BMI of 30 and above is considered obese.  Watch levels of cholesterol and blood lipids  You should start having your blood tested for lipids and cholesterol at 57 years of age, then have this test every 5 years.  You may need to have your cholesterol levels checked more often if: ? Your lipid or cholesterol levels are high. ? You are older than 57 years of age. ? You are at high risk for heart disease.  Cancer screening Lung Cancer  Lung cancer screening is recommended for adults 67-73 years old who are at high risk for lung cancer because of a history of smoking.  A yearly low-dose CT scan of the lungs is recommended for people who: ? Currently smoke. ? Have quit within the past 15 years. ? Have at least a 30-pack-year history of smoking. A pack year is smoking an average of one pack of cigarettes a day for 1 year.  Yearly screening should continue until it has been 15 years since you quit.  Yearly screening should stop if you develop a health problem that would prevent you from having lung cancer treatment.  Breast Cancer  Practice breast self-awareness.  This means understanding how your breasts normally appear and feel.  It also means doing regular breast self-exams. Let your health care provider know about any changes, no matter how small.  If you are in your 20s or 30s, you should have a clinical breast exam (CBE) by a health care provider every 1-3 years as part of a regular health exam.  If you are 29 or older, have a CBE every year. Also consider having a breast X-ray (mammogram) every year.  If you  have a family history of breast cancer, talk to your health care provider about genetic screening.  If you are at high risk for breast cancer, talk to your health care provider about having an MRI and a mammogram every year.  Breast cancer gene (BRCA) assessment is recommended for women who have family members with BRCA-related cancers. BRCA-related cancers include: ? Breast. ? Ovarian. ? Tubal. ? Peritoneal cancers.  Results of the assessment will determine the need for genetic counseling and BRCA1 and BRCA2 testing.  Cervical Cancer Your health care provider may recommend that you be screened regularly for cancer of the pelvic organs (ovaries, uterus, and vagina). This screening involves a pelvic examination, including checking for microscopic changes to the surface of your cervix (Pap test). You may be encouraged to have this screening done every 3 years, beginning at age 40.  For women ages 44-65, health care providers may recommend pelvic exams and Pap testing every 3 years, or they may recommend the Pap and pelvic exam, combined with testing for human papilloma virus (HPV), every 5 years. Some types of HPV increase your risk of cervical cancer. Testing for HPV may also be done on women of any age with unclear Pap test results.  Other health care providers may not recommend any screening for nonpregnant women who are considered low risk for pelvic cancer and who do not have symptoms. Ask your health care provider if a screening pelvic exam is right for you.  If you have had past treatment for cervical cancer or a condition that could lead to cancer, you need Pap tests and screening for cancer for at least 20 years after your treatment. If Pap tests have been discontinued, your risk factors (such as having a new sexual partner) need to be reassessed to determine if screening should resume. Some women have medical problems that increase the chance of getting cervical cancer. In these cases,  your health care provider may recommend more frequent screening and Pap tests.  Colorectal Cancer  This type of cancer can be detected and often prevented.  Routine colorectal cancer screening usually begins at 57 years of age and continues through 57 years of age.  Your health care provider may recommend screening at an earlier age if you have risk factors for colon cancer.  Your health care provider may also recommend using home test kits to check for hidden blood in the stool.  A small camera at the end of a tube can be used to examine your colon directly (sigmoidoscopy or colonoscopy). This is done to check for the earliest forms of colorectal cancer.  Routine screening usually begins at age 96.  Direct examination of the colon should be repeated every 5-10 years through 57 years of age. However, you may need to be screened more often if early forms of precancerous polyps or small growths are found.  Skin Cancer  Check your skin from head to toe regularly.  Tell your health care provider about any  new moles or changes in moles, especially if there is a change in a mole's shape or color.  Also tell your health care provider if you have a mole that is larger than the size of a pencil eraser.  Always use sunscreen. Apply sunscreen liberally and repeatedly throughout the day.  Protect yourself by wearing long sleeves, pants, a wide-brimmed hat, and sunglasses whenever you are outside.  Heart disease, diabetes, and high blood pressure  High blood pressure causes heart disease and increases the risk of stroke. High blood pressure is more likely to develop in: ? People who have blood pressure in the high end of the normal range (130-139/85-89 mm Hg). ? People who are overweight or obese. ? People who are African American.  If you are 58-1 years of age, have your blood pressure checked every 3-5 years. If you are 52 years of age or older, have your blood pressure checked every year.  You should have your blood pressure measured twice-once when you are at a hospital or clinic, and once when you are not at a hospital or clinic. Record the average of the two measurements. To check your blood pressure when you are not at a hospital or clinic, you can use: ? An automated blood pressure machine at a pharmacy. ? A home blood pressure monitor.  If you are between 24 years and 65 years old, ask your health care provider if you should take aspirin to prevent strokes.  Have regular diabetes screenings. This involves taking a blood sample to check your fasting blood sugar level. ? If you are at a normal weight and have a low risk for diabetes, have this test once every three years after 57 years of age. ? If you are overweight and have a high risk for diabetes, consider being tested at a younger age or more often. Preventing infection Hepatitis B  If you have a higher risk for hepatitis B, you should be screened for this virus. You are considered at high risk for hepatitis B if: ? You were born in a country where hepatitis B is common. Ask your health care provider which countries are considered high risk. ? Your parents were born in a high-risk country, and you have not been immunized against hepatitis B (hepatitis B vaccine). ? You have HIV or AIDS. ? You use needles to inject street drugs. ? You live with someone who has hepatitis B. ? You have had sex with someone who has hepatitis B. ? You get hemodialysis treatment. ? You take certain medicines for conditions, including cancer, organ transplantation, and autoimmune conditions.  Hepatitis C  Blood testing is recommended for: ? Everyone born from [birth year] through 1965. ? Anyone with known risk factors for hepatitis C.  Sexually transmitted infections (STIs)  You should be screened for sexually transmitted infections (STIs) including gonorrhea and chlamydia if: ? You are sexually active and are younger than 57 years of  age. ? You are older than 57 years of age and your health care provider tells you that you are at risk for this type of infection. ? Your sexual activity has changed since you were last screened and you are at an increased risk for chlamydia or gonorrhea. Ask your health care provider if you are at risk.  If you do not have HIV, but are at risk, it may be recommended that you take a prescription medicine daily to prevent HIV infection. This is called pre-exposure prophylaxis (PrEP). You are considered at  risk if: ? You are sexually active and do not regularly use condoms or know the HIV status of your partner(s). ? You take drugs by injection. ? You are sexually active with a partner who has HIV.  Talk with your health care provider about whether you are at high risk of being infected with HIV. If you choose to begin PrEP, you should first be tested for HIV. You should then be tested every 3 months for as long as you are taking PrEP. Pregnancy  If you are premenopausal and you may become pregnant, ask your health care provider about preconception counseling.  If you may become pregnant, take 400 to 800 micrograms (mcg) of folic acid every day.  If you want to prevent pregnancy, talk to your health care provider about birth control (contraception). Osteoporosis and menopause  Osteoporosis is a disease in which the bones lose minerals and strength with aging. This can result in serious bone fractures. Your risk for osteoporosis can be identified using a bone density scan.  If you are 6 years of age or older, or if you are at risk for osteoporosis and fractures, ask your health care provider if you should be screened.  Ask your health care provider whether you should take a calcium or vitamin D supplement to lower your risk for osteoporosis.  Menopause may have certain physical symptoms and risks.  Hormone replacement therapy may reduce some of these symptoms and risks. Talk to your health  care provider about whether hormone replacement therapy is right for you. Follow these instructions at home:  Schedule regular health, dental, and eye exams.  Stay current with your immunizations.  Do not use any tobacco products including cigarettes, chewing tobacco, or electronic cigarettes.  If you are pregnant, do not drink alcohol.  If you are breastfeeding, limit how much and how often you drink alcohol.  Limit alcohol intake to no more than 1 drink per day for nonpregnant women. One drink equals 12 ounces of beer, 5 ounces of wine, or 1 ounces of hard liquor.  Do not use street drugs.  Do not share needles.  Ask your health care provider for help if you need support or information about quitting drugs.  Tell your health care provider if you often feel depressed.  Tell your health care provider if you have ever been abused or do not feel safe at home. This information is not intended to replace advice given to you by your health care provider. Make sure you discuss any questions you have with your health care provider. Document Released: 10/11/2010 Document Revised: 09/03/2015 Document Reviewed: 12/30/2014 Elsevier Interactive Patient Education  [employer].

## 2017-11-28 NOTE — Progress Notes (Signed)
Subjective:    Patient ID: Jenny Haley, female    DOB: 07/02/1960, 57 y.o.   MRN: [id number]  HPI She is here for a physical exam.    There have been no changes in her health and she has no concerns.   Medications and allergies reviewed with patient and updated if appropriate.  Patient Active Problem List   Diagnosis Date Noted  . Hyperglycemia 09/21/2016  . Osteopenia 09/26/2015  . Hyperlipidemia 08/18/2015  . Allergic rhinitis 06/09/2011  . ARTHRITIS 09/14/2009  . MVP (mitral valve prolapse) 08/25/2009  . Angioedema 08/25/2009    Current Outpatient Medications on File Prior to Visit  Medication Sig Dispense Refill  . Fexofenadine HCl (ALLEGRA ALLERGY PO) Take by mouth as needed.    . Multiple Vitamin (MULTI VITAMIN DAILY PO) Take by mouth.    . Red Yeast Rice Extract (RED YEAST RICE PO) Take by mouth.     No current facility-administered medications on file prior to visit.     Past Medical History:  Diagnosis Date  . Allergy   . Angioedema    eval by rheum and allergy dr Irena Cordsvan winkle now  . Chicken pox   . H/O angioedema    neg rheum pos rf dr Dareen Pianoanderson 2011  . Infertility   . Mitral valve prolapse    echo years ago fainting in childhood   . Osteopenia     Past Surgical History:  Procedure Laterality Date  . LAPAROSCOPIC ABDOMINAL EXPLORATION  1992   Found minimal endometriosis    Social History   Socioeconomic History  . Marital status: Married    Spouse name: Not on file  . Number of children: Not on file  . Years of education: Not on file  . Highest education level: Not on file  Occupational History  . Not on file  Social Needs  . Financial resource strain: Not on file  . Food insecurity:    Worry: Not on file    Inability: Not on file  . Transportation needs:    Medical: Not on file    Non-medical: Not on file  Tobacco Use  . Smoking status: Never Smoker  . Smokeless tobacco: Never Used  Substance and Sexual Activity  .  Alcohol use: No  . Drug use: No  . Sexual activity: Not on file  Lifestyle  . Physical activity:    Days per week: Not on file    Minutes per session: Not on file  . Stress: Not on file  Relationships  . Social connections:    Talks on phone: Not on file    Gets together: Not on file    Attends religious service: Not on file    Active member of club or organization: Not on file    Attends meetings of clubs or organizations: Not on file    Relationship status: Not on file  Other Topics Concern  . Not on file  Social History Narrative   Sleeps about 6-7 hours of sleep per night (thanks to menopause)   2 people living in the home   Has a small dog in the home    p0 g0   No tobacco ets    Husband IT  [employer]ific [employer]    College degree  Homemaker  Caring for parents    Neg tad      Exercises - walks 4 miles, bikes 10 miles    Family History  Problem Relation Age of Onset  . Hyperlipidemia  Mother   . Hypertension Mother   . Arthritis Mother        hands   . Endometrial cancer Mother [id number]       surgery, radiation  . Esophageal cancer Father        Esophageal with metastisis to the liver  . Polycystic kidney disease Brother        congental cr 5 range, s/p transplant  . Hypertension Brother   . Colon cancer Neg Hx   . Rectal cancer Neg Hx   . Stomach cancer Neg Hx     Review of Systems  Constitutional: Negative for chills, fatigue and fever.  Eyes: Negative for visual disturbance.  Respiratory: Negative for cough, shortness of breath and wheezing.   Cardiovascular: Negative for chest pain, palpitations and leg swelling.  Gastrointestinal: Negative for abdominal pain, blood in stool, constipation, diarrhea and nausea.       No gerd  Genitourinary: Negative for difficulty urinating, dysuria and hematuria.  Musculoskeletal: Negative for arthralgias and back pain.       Morning stiffness  Skin: Negative for color change and rash.  Neurological: Negative for dizziness,  light-headedness and headaches.  Psychiatric/Behavioral: Positive for sleep disturbance (wakes a lot). Negative for dysphoric mood. The patient is not nervous/anxious.        Objective:   Vitals:   11/29/17 0755  BP: 124/78  Pulse: 70  Resp: 16  Temp: 98 F (36.7 C)  SpO2: 98%   Filed Weights   11/29/17 0755  Weight: 131 lb (59.4 kg)   Body mass index is 20.52 kg/m.  Wt Readings from Last 3 Encounters:  11/29/17 131 lb (59.4 kg)  09/21/16 131 lb (59.4 kg)  08/18/15 132 lb (59.9 kg)     Physical Exam Constitutional: She appears well-developed and well-nourished. No distress.  HENT:  Head: Normocephalic and atraumatic.  Right Ear: External ear normal. Normal ear canal and TM Left Ear: External ear normal.  Normal ear canal and TM Mouth/Throat: Oropharynx is clear and moist.  Eyes: Conjunctivae and EOM are normal.  Neck: Neck supple. No tracheal deviation present. No thyromegaly present.  No carotid bruit  Cardiovascular: Normal rate, regular rhythm and normal heart sounds.   No murmur heard.  No edema. Pulmonary/Chest: Effort normal and breath sounds normal. No respiratory distress. She has no wheezes. She has no rales.  Breast: deferred to Gyn Abdominal: Soft. She exhibits no distension. There is no tenderness.  Lymphadenopathy: She has no cervical adenopathy.  Skin: Skin is warm and dry. She is not diaphoretic.  Psychiatric: She has a normal mood and affect. Her behavior is normal.        Assessment & Plan:   Physical exam: Screening blood work   ordered Immunizations   Discussed shingrix, flu deferred, td up to date Colonoscopy    Up to date  Mammogram  Goes Monday for gyn and then mammo Gyn     scheduled Dexa   Due - has osteopenia - done last year - no change - due 2020 Eye exams  Up to date  EKG   Last done 2011 Exercise  Walking 2 mi a day, yoga, wii-fit, jumping rope Weight  Normal BMI Skin  Sees derm annually, no concerns Substance abuse    none  See Problem List for Assessment and Plan of chronic medical problems.    FU in one year

## 2017-11-29 ENCOUNTER — Ambulatory Visit (INDEPENDENT_AMBULATORY_CARE_PROVIDER_SITE_OTHER): Payer: BLUE CROSS/BLUE SHIELD | Admitting: Internal Medicine

## 2017-11-29 ENCOUNTER — Encounter: Payer: Self-pay | Admitting: Internal Medicine

## 2017-11-29 ENCOUNTER — Other Ambulatory Visit (INDEPENDENT_AMBULATORY_CARE_PROVIDER_SITE_OTHER): Payer: BLUE CROSS/BLUE SHIELD

## 2017-11-29 VITALS — BP 124/78 | HR 70 | Temp 98.0°F | Resp 16 | Ht 67.0 in | Wt 131.0 lb

## 2017-11-29 DIAGNOSIS — M85851 Other specified disorders of bone density and structure, right thigh: Secondary | ICD-10-CM

## 2017-11-29 DIAGNOSIS — E782 Mixed hyperlipidemia: Secondary | ICD-10-CM

## 2017-11-29 DIAGNOSIS — R739 Hyperglycemia, unspecified: Secondary | ICD-10-CM

## 2017-11-29 DIAGNOSIS — Z0000 Encounter for general adult medical examination without abnormal findings: Secondary | ICD-10-CM | POA: Diagnosis not present

## 2017-11-29 DIAGNOSIS — T783XXA Angioneurotic edema, initial encounter: Secondary | ICD-10-CM

## 2017-11-29 DIAGNOSIS — J309 Allergic rhinitis, unspecified: Secondary | ICD-10-CM

## 2017-11-29 LAB — CBC WITH DIFFERENTIAL/PLATELET
BASOS ABS: 0 10*3/uL (ref 0.0–0.1)
Basophils Relative: 0.4 % (ref 0.0–3.0)
EOS PCT: 2.5 % (ref 0.0–5.0)
Eosinophils Absolute: 0.1 10*3/uL (ref 0.0–0.7)
HCT: 44.8 % (ref 36.0–46.0)
Hemoglobin: 15.1 g/dL — ABNORMAL HIGH (ref 12.0–15.0)
LYMPHS ABS: 1.2 10*3/uL (ref 0.7–4.0)
Lymphocytes Relative: 25.1 % (ref 12.0–46.0)
MCHC: 33.6 g/dL (ref 30.0–36.0)
MCV: 92.4 fl (ref 78.0–100.0)
MONO ABS: 0.5 10*3/uL (ref 0.1–1.0)
Monocytes Relative: 10.6 % (ref 3.0–12.0)
NEUTROS ABS: 2.9 10*3/uL (ref 1.4–7.7)
NEUTROS PCT: 61.4 % (ref 43.0–77.0)
PLATELETS: 330 10*3/uL (ref 150.0–400.0)
RBC: 4.85 Mil/uL (ref 3.87–5.11)
RDW: 13.1 % (ref 11.5–15.5)
WBC: 4.7 10*3/uL (ref 4.0–10.5)

## 2017-11-29 LAB — TSH: TSH: 2.92 u[IU]/mL (ref 0.35–4.50)

## 2017-11-29 LAB — COMPREHENSIVE METABOLIC PANEL
ALK PHOS: 61 U/L (ref 39–117)
ALT: 17 U/L (ref 0–35)
AST: 21 U/L (ref 0–37)
Albumin: 4.8 g/dL (ref 3.5–5.2)
BILIRUBIN TOTAL: 0.7 mg/dL (ref 0.2–1.2)
BUN: 17 mg/dL (ref 6–23)
CO2: 25 meq/L (ref 19–32)
Calcium: 10.4 mg/dL (ref 8.4–10.5)
Chloride: 102 mEq/L (ref 96–112)
Creatinine, Ser: 0.8 mg/dL (ref 0.40–1.20)
GFR: 78.64 mL/min (ref >60.00)
GLUCOSE: 96 mg/dL (ref 70–99)
Potassium: 4.1 mEq/L (ref 3.5–5.1)
SODIUM: 139 meq/L (ref 135–145)
TOTAL PROTEIN: 7.7 g/dL (ref 6.0–8.3)

## 2017-11-29 LAB — LIPID PANEL
CHOL/HDL RATIO: 3
Cholesterol: 212 mg/dL — ABNORMAL HIGH (ref 0–200)
HDL: 78.6 mg/dL (ref >39.00)
LDL Cholesterol: 116 mg/dL — ABNORMAL HIGH (ref 0–99)
NonHDL: 133.66
Triglycerides: 87 mg/dL (ref 0.0–149.0)
VLDL: 17.4 mg/dL (ref 0.0–40.0)

## 2017-11-29 LAB — HEMOGLOBIN A1C: HEMOGLOBIN A1C: 5.5 % (ref 4.6–6.5)

## 2017-11-29 MED ORDER — EPINEPHRINE 0.3 MG/0.3ML IJ SOAJ: 0.3000 mg | Freq: Once | INTRAMUSCULAR | 5 refills | Status: AC

## 2017-11-29 NOTE — Assessment & Plan Note (Signed)
Genetic in nature Not on medication except red yeast rice Very healthy lifestyle Exercising Wants to avoid statin if possible Lipids, cmp, tsh

## 2017-11-29 NOTE — Assessment & Plan Note (Signed)
a1c

## 2017-11-29 NOTE — Assessment & Plan Note (Signed)
Controlled with allegra

## 2017-11-29 NOTE — Assessment & Plan Note (Signed)
Takes allegra daily One episode of angioedema since she was here last - has not needed epi-pen Refilled epi-pen Takes extra dose of allegra as needed for angioedema and it  resolves

## 2017-11-29 NOTE — Assessment & Plan Note (Signed)
dexa up to date - done 2018 -  Repeat due in 2020 Osteopenia - last dexa showed no change Exercising regularly, take MVI

## 2017-11-30 ENCOUNTER — Encounter: Payer: Self-pay | Admitting: Internal Medicine

## 2017-12-04 ENCOUNTER — Other Ambulatory Visit (HOSPITAL_COMMUNITY)
Admission: RE | Admit: 2017-12-04 | Discharge: 2017-12-04 | Disposition: A | Discharge status: Home / Self Care | Payer: BLUE CROSS/BLUE SHIELD | Source: Ambulatory Visit | Attending: Obstetrics and Gynecology | Admitting: Obstetrics and Gynecology

## 2017-12-04 ENCOUNTER — Ambulatory Visit: Payer: BLUE CROSS/BLUE SHIELD | Admitting: Obstetrics and Gynecology

## 2017-12-04 ENCOUNTER — Other Ambulatory Visit: Payer: Self-pay

## 2017-12-04 ENCOUNTER — Other Ambulatory Visit: Payer: Self-pay | Admitting: Internal Medicine

## 2017-12-04 ENCOUNTER — Encounter: Payer: Self-pay | Admitting: Obstetrics and Gynecology

## 2017-12-04 VITALS — BP 132/78 | HR 80 | Resp 14 | Ht 65.0 in | Wt 131.0 lb

## 2017-12-04 DIAGNOSIS — N952 Postmenopausal atrophic vaginitis: Secondary | ICD-10-CM | POA: Diagnosis not present

## 2017-12-04 DIAGNOSIS — Z124 Encounter for screening for malignant neoplasm of cervix: Secondary | ICD-10-CM | POA: Insufficient documentation

## 2017-12-04 DIAGNOSIS — N882 Stricture and stenosis of cervix uteri: Secondary | ICD-10-CM | POA: Diagnosis not present

## 2017-12-04 DIAGNOSIS — Z780 Asymptomatic menopausal state: Secondary | ICD-10-CM

## 2017-12-04 DIAGNOSIS — Z01419 Encounter for gynecological examination (general) (routine) without abnormal findings: Secondary | ICD-10-CM

## 2017-12-04 DIAGNOSIS — Z1151 Encounter for screening for human papillomavirus (HPV): Secondary | ICD-10-CM | POA: Diagnosis not present

## 2017-12-04 DIAGNOSIS — Z1231 Encounter for screening mammogram for malignant neoplasm of breast: Secondary | ICD-10-CM

## 2017-12-04 DIAGNOSIS — M8580 Other specified disorders of bone density and structure, unspecified site: Secondary | ICD-10-CM

## 2017-12-04 NOTE — Patient Instructions (Signed)
EXERCISE AND DIET:  We recommended that you start or continue a regular exercise program for good health. Regular exercise means any activity that makes your heart beat faster and makes you sweat.  We recommend exercising at least 30 minutes per day at least 3 days a week, preferably 4 or 5.  We also recommend a diet low in fat and sugar.  Inactivity, poor dietary choices and obesity can cause diabetes, heart attack, stroke, and kidney damage, among others.    ALCOHOL AND SMOKING:  Women should limit their alcohol intake to no more than 7 drinks/beers/glasses of wine (combined, not each!) per week. Moderation of alcohol intake to this level decreases your risk of breast cancer and liver damage. And of course, no recreational drugs are part of a healthy lifestyle.  And absolutely no smoking or even second hand smoke. Most people know smoking can cause heart and lung diseases, but did you know it also contributes to weakening of your bones? Aging of your skin?  Yellowing of your teeth and nails?  CALCIUM AND VITAMIN D:  Adequate intake of calcium and Vitamin D are recommended.  The recommendations for exact amounts of these supplements seem to change often, but generally speaking 600 mg of calcium (either carbonate or citrate) and 800 units of Vitamin D per day seems prudent. Certain women may benefit from higher intake of Vitamin D.  If you are among these women, your doctor will have told you during your visit.    PAP SMEARS:  Pap smears, to check for cervical cancer or precancers,  have traditionally been done yearly, although recent scientific advances have shown that most women can have pap smears less often.  However, every woman still should have a physical exam from her gynecologist every year. It will include a breast check, inspection of the vulva and vagina to check for abnormal growths or skin changes, a visual exam of the cervix, and then an exam to evaluate the size and shape of the uterus and  ovaries.  And after 57 years of age, a rectal exam is indicated to check for rectal cancers. We will also provide age appropriate advice regarding health maintenance, like when you should have certain vaccines, screening for sexually transmitted diseases, bone density testing, colonoscopy, mammograms, etc.   MAMMOGRAMS:  All women over 40 years old should have a yearly mammogram. Many facilities now offer a "3D" mammogram, which may cost around $50 extra out of pocket. If possible,  we recommend you accept the option to have the 3D mammogram performed.  It both reduces the number of women who will be called back for extra views which then turn out to be normal, and it is better than the routine mammogram at detecting truly abnormal areas.    COLONOSCOPY:  Colonoscopy to screen for colon cancer is recommended for all women at age 50.  We know, you hate the idea of the prep.  We agree, BUT, having colon cancer and not knowing it is worse!!  Colon cancer so often starts as a polyp that can be seen and removed at colonscopy, which can quite literally save your life!  And if your first colonoscopy is normal and you have no family history of colon cancer, most women don't have to have it again for 10 years.  Once every ten years, you can do something that may end up saving your life, right?  We will be happy to help you get it scheduled when you are ready.    Be sure to check your insurance coverage so you understand how much it will cost.  It may be covered as a preventative service at no cost, but you should check your particular policy.      Breast Self-Awareness Breast self-awareness means being familiar with how your breasts look and feel. It involves checking your breasts regularly and reporting any changes to your health care provider. Practicing breast self-awareness is important. A change in your breasts can be a sign of a serious medical problem. Being familiar with how your breasts look and feel allows  you to find any problems early, when treatment is more likely to be successful. All women should practice breast self-awareness, including women who have had breast implants. How to do a breast self-exam One way to learn what is normal for your breasts and whether your breasts are changing is to do a breast self-exam. To do a breast self-exam: Look for Changes  1. Remove all the clothing above your waist. 2. Stand in front of a mirror in a room with good lighting. 3. Put your hands on your hips. 4. Push your hands firmly downward. 5. Compare your breasts in the mirror. Look for differences between them (asymmetry), such as: ? Differences in shape. ? Differences in size. ? Puckers, dips, and bumps in one breast and not the other. 6. Look at each breast for changes in your skin, such as: ? Redness. ? Scaly areas. 7. Look for changes in your nipples, such as: ? Discharge. ? Bleeding. ? Dimpling. ? Redness. ? A change in position. Feel for Changes  Carefully feel your breasts for lumps and changes. It is best to do this while lying on your back on the floor and again while sitting or standing in the shower or tub with soapy water on your skin. Feel each breast in the following way:  Place the arm on the side of the breast you are examining above your head.  Feel your breast with the other hand.  Start in the nipple area and make  inch (2 cm) overlapping circles to feel your breast. Use the pads of your three middle fingers to do this. Apply light pressure, then medium pressure, then firm pressure. The light pressure will allow you to feel the tissue closest to the skin. The medium pressure will allow you to feel the tissue that is a little deeper. The firm pressure will allow you to feel the tissue close to the ribs.  Continue the overlapping circles, moving downward over the breast until you feel your ribs below your breast.  Move one finger-width toward the center of the body.  Continue to use the  inch (2 cm) overlapping circles to feel your breast as you move slowly up toward your collarbone.  Continue the up and down exam using all three pressures until you reach your armpit.  Write Down What You Find  Write down what is normal for each breast and any changes that you find. Keep a written record with breast changes or normal findings for each breast. By writing this information down, you do not need to depend only on memory for size, tenderness, or location. Write down where you are in your menstrual cycle, if you are still menstruating. If you are having trouble noticing differences in your breasts, do not get discouraged. With time you will become more familiar with the variations in your breasts and more comfortable with the exam. How often should I examine my breasts? Examine   your breasts every month. If you are breastfeeding, the best time to examine your breasts is after a feeding or after using a breast pump. If you menstruate, the best time to examine your breasts is 5-7 days after your period is over. During your period, your breasts are lumpier, and it may be more difficult to notice changes. When should I see my health care provider? See your health care provider if you notice:  A change in shape or size of your breasts or nipples.  A change in the skin of your breast or nipples, such as a reddened or scaly area.  Unusual discharge from your nipples.  A lump or thick area that was not there before.  Pain in your breasts.  Anything that concerns you.  This information is not intended to replace advice given to you by your health care provider. Make sure you discuss any questions you have with your health care provider. Document Released: 03/28/2005 Document Revised: 09/03/2015 Document Reviewed: 02/15/2015 Elsevier Interactive Patient Education  2018 [employer]  

## 2017-12-04 NOTE — Progress Notes (Signed)
57 y.o. G0P0 MarriedCaucasianF here for annual exam.  PMP for over 5 years. For at least 3 years the GYN has had trouble finding her cervix.  She has some dryness with intercourse, fine with lubrication. No vaginal bleeding. No h/o abnormal paps. H/O infertility, mild endometriosis.  They have one adopted son, [id number].  Period Cycle (Days): (post menopausal)  No LMP recorded. Patient is postmenopausal.          Sexually active: Yes.    The current method of family planning is post menopausal status.    Exercising: Yes.    running, yoga, aerobics Smoker:  no  Health Maintenance: Pap: last year per patient, unable to see cervix so sample was not taken History of abnormal Pap:  no MMG:  2018, due Colonoscopy:  05/02/2013 BMD:   2018, osteopenia, followed by primary TDaP:  Current per patient Gardasil: n/a   reports that she has never smoked. She has never used smokeless tobacco. She reports that she does not drink alcohol or use drugs.  She has been taking care of her mother, whom [place] own [place]. Her father died 5 years ago, mom has been having issues since he died. Lanora Manislizabeth see's her most days. Son is [id number] and local.    Past Medical History:  Diagnosis Date  . Allergy   . Angioedema    eval by rheum and allergy dr Irena Cordsvan winkle now  . Chicken pox   . H/O angioedema    neg rheum pos rf dr Dareen Pianoanderson 2011  . Infertility   . Mitral valve prolapse    echo years ago fainting in childhood   . Osteopenia     Past Surgical History:  Procedure Laterality Date  . LAPAROSCOPIC ABDOMINAL EXPLORATION  1992   Found minimal endometriosis  . WISDOM TOOTH EXTRACTION      Current Outpatient Medications  Medication Sig Dispense Refill  . EPINEPHrine 0.3 mg/0.3 mL IJ SOAJ injection as needed.    Marland Kitchen. Fexofenadine HCl (ALLEGRA ALLERGY PO) Take by mouth as needed.    . Multiple Vitamin (MULTI VITAMIN DAILY PO) Take by mouth.    . Red Yeast Rice Extract (RED YEAST RICE PO) Take  by mouth.     No current facility-administered medications for this visit.     Family History  Problem Relation Age of Onset  . Hyperlipidemia Mother   . Hypertension Mother   . Arthritis Mother        hands   . Endometrial cancer Mother [id number]       surgery, radiation  . Esophageal cancer Father        Esophageal with metastisis to the liver  . Polycystic kidney disease Brother        congental cr 5 range, s/p transplant  . Hypertension Brother   . Colon cancer Neg Hx   . Rectal cancer Neg Hx   . Stomach cancer Neg Hx   Father died 5 years ago.   Review of Systems  Constitutional: Negative.   HENT: Negative.   Eyes: Negative.   Respiratory: Negative.   Cardiovascular: Negative.   Gastrointestinal: Negative.   Endocrine: Negative.   Genitourinary: Negative.        Vaginal dryness  Musculoskeletal: Negative.   Skin: Negative.   Allergic/Immunologic: Negative.   Neurological: Negative.   Hematological: Negative.   Psychiatric/Behavioral: Negative.   All other systems reviewed and are negative.   Exam:   BP 132/78   Pulse 80  Resp 14   Ht 5\' 5"  (1.651 m)   Wt 131 lb (59.4 kg)   BMI 21.80 kg/m   Weight change: @WEIGHTCHANGE @ Height:   Height: 5\' 5"  (165.1 cm)  Ht Readings from Last 3 Encounters:  12/04/17 5\' 5"  (1.651 m)  11/29/17 5\' 7"  (1.702 m)  09/21/16 5\' 7"  (1.702 m)    General appearance: alert, cooperative and appears stated age Head: Normocephalic, without obvious abnormality, atraumatic Neck: no adenopathy, supple, symmetrical, trachea midline and thyroid normal to inspection and palpation Lungs: clear to auscultation bilaterally Cardiovascular: regular rate and rhythm Breasts: normal appearance, no masses or tenderness Abdomen: soft, non-tender; non distended,  no masses,  no organomegaly Extremities: extremities normal, atraumatic, no cyanosis or edema Skin: Skin color, texture, turgor normal. No rashes or lesions Lymph nodes: Cervical,  supraclavicular, and axillary nodes normal. No abnormal inguinal nodes palpated Neurologic: Grossly normal   Pelvic: External genitalia:  no lesions              Urethra:  normal appearing urethra with no masses, tenderness or lesions              Bartholins and Skenes: normal                 Vagina: normal appearing vagina with normal color and discharge, no lesions              Cervix: stenotic, flush with vagina, friable, some friable tissue extending into the upper right vagina.                Bimanual Exam:  Uterus:  normal size, contour, position, consistency, mobility, non-tender              Adnexa: no mass, fullness, tenderness               Rectovaginal: Confirms               Anus:  normal sphincter tone, no lesions  Chaperone was present for exam.  A:  Well Woman with normal exam  PMP  Atrophic vaginitis, not bothersome  Stenotic cervix, friable  Osteopenia, followed by primary MD  P:   Pap with hpv  Labs with primary  Mammogram due  Colonoscopy UTD  She is getting calcium and vit D

## 2017-12-05 LAB — CYTOLOGY - PAP
DIAGNOSIS: NEGATIVE
HPV (WINDOPATH): NOT DETECTED

## 2018-01-11 ENCOUNTER — Ambulatory Visit
Admission: RE | Admit: 2018-01-11 | Discharge: 2018-01-11 | Disposition: A | Discharge status: Home / Self Care | Payer: BLUE CROSS/BLUE SHIELD | Source: Ambulatory Visit | Attending: Internal Medicine | Admitting: Internal Medicine

## 2018-01-11 DIAGNOSIS — Z1231 Encounter for screening mammogram for malignant neoplasm of breast: Secondary | ICD-10-CM

## 2018-12-04 ENCOUNTER — Other Ambulatory Visit: Payer: Self-pay

## 2018-12-04 NOTE — Progress Notes (Signed)
58 y.o. G0P0001 Married White or Caucasian Not Hispanic or Latino female here for annual exam.   H/O vaginal atrophy (tolerable) and stenotic friable cervix. No vaginal bleeding. Sexually active, no pain, or spotting.   Mom with a h/o endometrial cancer, she has recurrence in her vaginal cuff. She is on megace and tamoxifen. She is 69, has other medical issues. Lives locally.     No LMP recorded. Patient is postmenopausal.          Sexually active: Yes.    The current method of family planning is post menopausal status.    Exercising: Yes.    walking, jump rope, and yoga Smoker:  no  Health Maintenance: Pap: 12/04/2017 WNL NEG HPV History of abnormal Pap:  no MMG:  01/11/2018 Birads 1 negative Colonoscopy:  05/02/2013, f/u in 10 years BMD:   2018, osteopenia, followed by primary TDaP: 08/18/15 Gardasil: n/a   reports that she has never smoked. She has never used smokeless tobacco. She reports that she does not drink alcohol or use drugs. Son is 104 and local. She cares for her mother.   Past Medical History:  Diagnosis Date  . Allergy   . Angioedema    eval by rheum and allergy dr Harold Hedge now  . Chicken pox   . H/O angioedema    neg rheum pos rf dr Ouida Sills 2011  . Infertility   . Mitral valve prolapse    echo years ago fainting in childhood   . Osteopenia     Past Surgical History:  Procedure Laterality Date  . LAPAROSCOPIC ABDOMINAL EXPLORATION  1992   Found minimal endometriosis  . WISDOM TOOTH EXTRACTION      Current Outpatient Medications  Medication Sig Dispense Refill  . EPINEPHrine 0.3 mg/0.3 mL IJ SOAJ injection as needed.    Marland Kitchen Fexofenadine HCl (ALLEGRA ALLERGY PO) Take by mouth as needed.    . Multiple Vitamin (MULTI VITAMIN DAILY PO) Take by mouth.    . Red Yeast Rice Extract (RED YEAST RICE PO) Take by mouth.     No current facility-administered medications for this visit.     Family History  Problem Relation Age of Onset  . Hyperlipidemia Mother    . Hypertension Mother   . Arthritis Mother        hands   . Endometrial cancer Mother 57       surgery, radiation  . Vaginal cancer Mother   . Esophageal cancer Father        Esophageal with metastisis to the liver  . Polycystic kidney disease Brother        congental cr 5 range, s/p transplant  . Hypertension Brother   . Colon cancer Neg Hx   . Rectal cancer Neg Hx   . Stomach cancer Neg Hx     Review of Systems  Constitutional: Negative.   HENT: Negative.   Eyes: Negative.   Respiratory: Negative.   Cardiovascular: Negative.   Gastrointestinal: Negative.   Endocrine: Negative.   Genitourinary: Negative.   Musculoskeletal: Negative.   Skin: Negative.   Allergic/Immunologic: Negative.   Neurological: Negative.   Hematological: Negative.   Psychiatric/Behavioral: Negative.     Exam:   BP 108/66 (BP Location: Right Arm, Patient Position: Sitting, Cuff Size: Normal)   Pulse 80   Temp (!) 97.2 F (36.2 C) (Temporal)   Resp 14   Ht 5\' 5"  (1.651 m)   Wt 129 lb 12.8 oz (58.9 kg)   BMI 21.60 kg/m  Weight change: @WEIGHTCHANGE @ Height:   Height: 5\' 5"  (165.1 cm)  Ht Readings from Last 3 Encounters:  12/06/18 5\' 5"  (1.651 m)  12/04/17 5\' 5"  (1.651 m)  11/29/17 5\' 7"  (1.702 m)    General appearance: alert, cooperative and appears stated age Head: Normocephalic, without obvious abnormality, atraumatic Neck: no adenopathy, supple, symmetrical, trachea midline and thyroid normal to inspection and palpation Lungs: clear to auscultation bilaterally Cardiovascular: regular rate and rhythm Breasts: normal appearance, no masses or tenderness Abdomen: soft, non-tender; non distended,  no masses,  no organomegaly Extremities: extremities normal, atraumatic, no cyanosis or edema Skin: Skin color, texture, turgor normal. No rashes or lesions Lymph nodes: Cervical, supraclavicular, and axillary nodes normal. No abnormal inguinal nodes palpated Neurologic: Grossly  normal   Pelvic: External genitalia:  no lesions              Urethra:  normal appearing urethra with no masses, tenderness or lesions              Bartholins and Skenes: normal                 Vagina: normal appearing vagina with normal color and discharge, no lesions              Cervix: no lesions, stenotic, flush with vagina.               Bimanual Exam:  Uterus:  normal size, contour, position, consistency, mobility, non-tender              Adnexa: no mass, fullness, tenderness               Rectovaginal: Confirms               Anus:  normal sphincter tone, no lesions  Chaperone was present for exam.  A:  Well Woman with normal exam  H/O osteopenia, followed by her primary  P:   No pap this year  Mammogram in 10/20  Colonoscopy UTD  DEXA with primary  Labs with primary  Discussed breast self exam  Discussed calcium and vit D intake

## 2018-12-06 ENCOUNTER — Ambulatory Visit (INDEPENDENT_AMBULATORY_CARE_PROVIDER_SITE_OTHER): Payer: BC Managed Care – PPO | Admitting: Obstetrics and Gynecology

## 2018-12-06 ENCOUNTER — Other Ambulatory Visit: Payer: Self-pay

## 2018-12-06 ENCOUNTER — Encounter: Payer: Self-pay | Admitting: Obstetrics and Gynecology

## 2018-12-06 VITALS — BP 108/66 | HR 80 | Temp 97.2°F | Resp 14 | Ht 65.0 in | Wt 129.8 lb

## 2018-12-06 DIAGNOSIS — M8580 Other specified disorders of bone density and structure, unspecified site: Secondary | ICD-10-CM

## 2018-12-06 DIAGNOSIS — N882 Stricture and stenosis of cervix uteri: Secondary | ICD-10-CM | POA: Diagnosis not present

## 2018-12-06 DIAGNOSIS — Z01419 Encounter for gynecological examination (general) (routine) without abnormal findings: Secondary | ICD-10-CM | POA: Diagnosis not present

## 2018-12-06 DIAGNOSIS — N952 Postmenopausal atrophic vaginitis: Secondary | ICD-10-CM

## 2018-12-06 NOTE — Patient Instructions (Signed)

## 2019-03-06 DIAGNOSIS — Z03818 Encounter for observation for suspected exposure to other biological agents ruled out: Secondary | ICD-10-CM | POA: Diagnosis not present

## 2019-03-11 DIAGNOSIS — Z03818 Encounter for observation for suspected exposure to other biological agents ruled out: Secondary | ICD-10-CM | POA: Diagnosis not present

## 2019-03-18 DIAGNOSIS — Z03818 Encounter for observation for suspected exposure to other biological agents ruled out: Secondary | ICD-10-CM | POA: Diagnosis not present

## 2019-03-28 ENCOUNTER — Other Ambulatory Visit: Payer: Self-pay | Admitting: Internal Medicine

## 2019-03-28 DIAGNOSIS — Z1231 Encounter for screening mammogram for malignant neoplasm of breast: Secondary | ICD-10-CM

## 2019-04-24 DIAGNOSIS — Z03818 Encounter for observation for suspected exposure to other biological agents ruled out: Secondary | ICD-10-CM | POA: Diagnosis not present

## 2019-05-06 DIAGNOSIS — Z03818 Encounter for observation for suspected exposure to other biological agents ruled out: Secondary | ICD-10-CM | POA: Diagnosis not present

## 2019-05-15 ENCOUNTER — Ambulatory Visit
Admission: RE | Admit: 2019-05-15 | Discharge: 2019-05-15 | Disposition: A | Discharge status: Home / Self Care | Payer: BC Managed Care – PPO | Source: Ambulatory Visit | Attending: Internal Medicine | Admitting: Internal Medicine

## 2019-05-15 ENCOUNTER — Other Ambulatory Visit: Payer: Self-pay

## 2019-05-15 DIAGNOSIS — Z1231 Encounter for screening mammogram for malignant neoplasm of breast: Secondary | ICD-10-CM | POA: Diagnosis not present

## 2019-05-21 DIAGNOSIS — Z03818 Encounter for observation for suspected exposure to other biological agents ruled out: Secondary | ICD-10-CM | POA: Diagnosis not present

## 2019-05-25 DIAGNOSIS — Z03818 Encounter for observation for suspected exposure to other biological agents ruled out: Secondary | ICD-10-CM | POA: Diagnosis not present

## 2019-05-29 DIAGNOSIS — Z03818 Encounter for observation for suspected exposure to other biological agents ruled out: Secondary | ICD-10-CM | POA: Diagnosis not present

## 2019-06-05 DIAGNOSIS — Z03818 Encounter for observation for suspected exposure to other biological agents ruled out: Secondary | ICD-10-CM | POA: Diagnosis not present

## 2019-06-10 DIAGNOSIS — Z03818 Encounter for observation for suspected exposure to other biological agents ruled out: Secondary | ICD-10-CM | POA: Diagnosis not present

## 2019-07-03 DIAGNOSIS — Z03818 Encounter for observation for suspected exposure to other biological agents ruled out: Secondary | ICD-10-CM | POA: Diagnosis not present

## 2019-07-08 DIAGNOSIS — Z03818 Encounter for observation for suspected exposure to other biological agents ruled out: Secondary | ICD-10-CM | POA: Diagnosis not present

## 2019-07-10 DIAGNOSIS — Z03818 Encounter for observation for suspected exposure to other biological agents ruled out: Secondary | ICD-10-CM | POA: Diagnosis not present

## 2019-07-15 DIAGNOSIS — Z03818 Encounter for observation for suspected exposure to other biological agents ruled out: Secondary | ICD-10-CM | POA: Diagnosis not present

## 2019-07-17 DIAGNOSIS — Z03818 Encounter for observation for suspected exposure to other biological agents ruled out: Secondary | ICD-10-CM | POA: Diagnosis not present

## 2019-07-22 DIAGNOSIS — Z03818 Encounter for observation for suspected exposure to other biological agents ruled out: Secondary | ICD-10-CM | POA: Diagnosis not present

## 2019-08-05 DIAGNOSIS — Z03818 Encounter for observation for suspected exposure to other biological agents ruled out: Secondary | ICD-10-CM | POA: Diagnosis not present

## 2019-08-10 DIAGNOSIS — M5441 Lumbago with sciatica, right side: Secondary | ICD-10-CM | POA: Diagnosis not present

## 2019-08-10 DIAGNOSIS — S338XXA Sprain of other parts of lumbar spine and pelvis, initial encounter: Secondary | ICD-10-CM | POA: Diagnosis not present

## 2019-08-12 NOTE — Progress Notes (Signed)
Subjective:    Patient ID: Jenny Haley, female    DOB: Sep 26, 1960, 59 y.o.   MRN: [id number]  HPI She is here for a physical exam.   She hurt her back over the weekend and went to urgent care.  She was placed on prednisone and a muscle relaxer.  Her pain is better.  She has to help her mom transfer and she is a large women.  Two days ago her right thigh felt like fire.  Prior to that she did not have radiating pain.   She has no concerns.   Medications and allergies reviewed with patient and updated if appropriate.  Patient Active Problem List   Diagnosis Date Noted  . Hyperglycemia 09/21/2016  . Osteopenia 09/26/2015  . Hyperlipidemia 08/18/2015  . Allergic rhinitis 06/09/2011  . MVP (mitral valve prolapse) 08/25/2009  . Angioedema 08/25/2009    Current Outpatient Medications on File Prior to Visit  Medication Sig Dispense Refill  . cyclobenzaprine (FLEXERIL) 10 MG tablet Take 10 mg by mouth 2 (two) times daily as needed.    Marland Kitchen EPINEPHrine 0.3 mg/0.3 mL IJ SOAJ injection as needed.    Marland Kitchen Fexofenadine HCl (ALLEGRA ALLERGY PO) Take by mouth as needed.    . Multiple Vitamin (MULTI VITAMIN DAILY PO) Take by mouth.    . predniSONE (DELTASONE) 20 MG tablet Take 40 mg by mouth every morning.    . Red Yeast Rice Extract (RED YEAST RICE PO) Take by mouth.     No current facility-administered medications on file prior to visit.    Past Medical History:  Diagnosis Date  . Allergy   . Angioedema    eval by rheum and allergy dr Irena Cords now  . Chicken pox   . H/O angioedema    neg rheum pos rf dr Dareen Piano 2011  . Infertility   . Mitral valve prolapse    echo years ago fainting in childhood   . Osteopenia     Past Surgical History:  Procedure Laterality Date  . LAPAROSCOPIC ABDOMINAL EXPLORATION  1992   Found minimal endometriosis  . WISDOM TOOTH EXTRACTION      Social History   Socioeconomic History  . Marital status: Married    Spouse name: Not on  file  . Number of children: Not on file  . Years of education: Not on file  . Highest education level: Not on file  Occupational History  . Not on file  Tobacco Use  . Smoking status: Never Smoker  . Smokeless tobacco: Never Used  Substance and Sexual Activity  . Alcohol use: No  . Drug use: No  . Sexual activity: Yes    Birth control/protection: Post-menopausal, Surgical  Other Topics Concern  . Not on file  Social History Narrative   Sleeps about 6-7 hours of sleep per night (thanks to menopause)   2 people living in the home   Has a small dog in the home    p0 g0   No tobacco ets    Husband IT  Scientific laboratory technician  Caring for parents    Neg tad      Exercises - walks 4 miles, bikes 10 miles   Social Determinants of Health   Financial Resource Strain:   . Difficulty of Paying Living Expenses:   Food Insecurity:   . Worried About Programme researcher, broadcasting/film/video in the Last Year:   . [employer] of Food in the Last  Year:   Transportation Needs:   . Film/video editor (Medical):   Marland Kitchen Lack of Transportation (Non-Medical):   Physical Activity:   . Days of Exercise per Week:   . Minutes of Exercise per Session:   Stress:   . Feeling of Stress :   Social Connections:   . Frequency of Communication with Friends and Family:   . Frequency of Social Gatherings with Friends and Family:   . Attends Religious Services:   . Active Member of Clubs or Organizations:   . Attends Archivist Meetings:   Marland Kitchen Marital Status:     Family History  Problem Relation Age of Onset  . Hyperlipidemia Mother   . Hypertension Mother   . Arthritis Mother        hands   . Endometrial cancer Mother 71       surgery, radiation  . Vaginal cancer Mother   . Esophageal cancer Father        Esophageal with metastisis to the liver  . Polycystic kidney disease Brother        congental cr 5 range, s/p transplant  . Hypertension Brother   . Colon cancer Neg Hx   . Rectal cancer Neg  Hx   . Stomach cancer Neg Hx     Review of Systems  Constitutional: Negative for chills and fever.  Eyes: Negative for visual disturbance.  Respiratory: Negative for cough, shortness of breath and wheezing.   Cardiovascular: Negative for chest pain, palpitations and leg swelling.  Gastrointestinal: Positive for constipation. Negative for abdominal pain, blood in stool, diarrhea and nausea.       No Gerd  Genitourinary: Negative for dysuria and hematuria.  Musculoskeletal: Positive for back pain. Negative for arthralgias.  Skin: Negative for color change and rash.  Neurological: Negative for light-headedness, numbness and headaches.  Psychiatric/Behavioral: Negative for dysphoric mood. The patient is not nervous/anxious.        Objective:   Vitals:   08/13/19 0919  BP: 132/76  Pulse: 91  Resp: 16  Temp: 98.1 F (36.7 C)  SpO2: 99%   Filed Weights   08/13/19 0919  Weight: 130 lb (59 kg)   Body mass index is 21.63 kg/m.  BP Readings from Last 3 Encounters:  08/13/19 132/76  12/06/18 108/66  12/04/17 132/78    Wt Readings from Last 3 Encounters:  08/13/19 130 lb (59 kg)  12/06/18 129 lb 12.8 oz (58.9 kg)  12/04/17 131 lb (59.4 kg)     Physical Exam Constitutional: She appears well-developed and well-nourished. No distress.  HENT:  Head: Normocephalic and atraumatic.  Right Ear: External ear normal. Normal ear canal and TM Left Ear: External ear normal.  Normal ear canal and TM Mouth/Throat: Oropharynx is clear and moist.  Eyes: Conjunctivae and EOM are normal.  Neck: Neck supple. No tracheal deviation present. No thyromegaly present.  No carotid bruit  Cardiovascular: Normal rate, regular rhythm and normal heart sounds.   No murmur heard.  No edema. Pulmonary/Chest: Effort normal and breath sounds normal. No respiratory distress. She has no wheezes. She has no rales.  Breast: deferred   Abdominal: Soft. She exhibits no distension. There is no tenderness.    Msk: tenderness R lower back Lymphadenopathy: She has no cervical adenopathy.  Skin: Skin is warm and dry. She is not diaphoretic.  Psychiatric: She has a normal mood and affect. Her behavior is normal.        Assessment & Plan:   Physical  exam: Screening blood work    ordered Immunizations  - discussed covid and shingrix Colonoscopy  Up to date  Mammogram  Up to date  Gyn  Up to date  Dexa  Due -- ordered Eye exams  Not up to date - will schedule Exercise  Walks, yoga Weight  Normal BMI Substance abuse  None Sees derm   See Problem List for Assessment and Plan of chronic medical problems.     This visit occurred during the SARS-CoV-2 public health emergency.  Safety protocols were in place, including screening questions prior to the visit, additional usage of staff PPE, and extensive cleaning of exam room while observing appropriate contact time as indicated for disinfecting solutions.

## 2019-08-12 NOTE — Patient Instructions (Addendum)
Blood work was ordered.   A bone density was ordered.   All other Health Maintenance issues reviewed.   All recommended immunizations and age-appropriate screenings are up-to-date or discussed.  No immunization administered today.   Medications reviewed and updated.  Changes include :   none   Please followup in 1 year    Low Back Sprain or Strain Rehab Ask your health care provider which exercises are safe for you. Do exercises exactly as told by your health care provider and adjust them as directed. It is normal to feel mild stretching, pulling, tightness, or discomfort as you do these exercises. Stop right away if you feel sudden pain or your pain gets worse. Do not begin these exercises until told by your health care provider. Stretching and range-of-motion exercises These exercises warm up your muscles and joints and improve the movement and flexibility of your back. These exercises also help to relieve pain, numbness, and tingling. Lumbar rotation  1. Lie on your back on a firm surface and bend your knees. 2. Straighten your arms out to your sides so each arm forms a 90-degree angle (right angle) with a side of your body. 3. Slowly move (rotate) both of your knees to one side of your body until you feel a stretch in your lower back (lumbar). Try not to let your shoulders lift off the floor. 4. Hold this position for __________ seconds. 5. Tense your abdominal muscles and slowly move your knees back to the starting position. 6. Repeat this exercise on the other side of your body. Repeat __________ times. Complete this exercise __________ times a day. Single knee to chest  1. Lie on your back on a firm surface with both legs straight. 2. Bend one of your knees. Use your hands to move your knee up toward your chest until you feel a gentle stretch in your lower back and buttock. ? Hold your leg in this position by holding on to the front of your knee. ? Keep your other leg as  straight as possible. 3. Hold this position for __________ seconds. 4. Slowly return to the starting position. 5. Repeat with your other leg. Repeat __________ times. Complete this exercise __________ times a day. Prone extension on elbows  1. Lie on your abdomen on a firm surface (prone position). 2. Prop yourself up on your elbows. 3. Use your arms to help lift your chest up until you feel a gentle stretch in your abdomen and your lower back. ? This will place some of your body weight on your elbows. If this is uncomfortable, try stacking pillows under your chest. ? Your hips should stay down, against the surface that you are lying on. Keep your hip and back muscles relaxed. 4. Hold this position for __________ seconds. 5. Slowly relax your upper body and return to the starting position. Repeat __________ times. Complete this exercise __________ times a day. Strengthening exercises These exercises build strength and endurance in your back. Endurance is the ability to use your muscles for a long time, even after they get tired. Pelvic tilt This exercise strengthens the muscles that lie deep in the abdomen. 1. Lie on your back on a firm surface. Bend your knees and keep your feet flat on the floor. 2. Tense your abdominal muscles. Tip your pelvis up toward the ceiling and flatten your lower back into the floor. ? To help with this exercise, you may place a small towel under your lower back and try to  push your back into the towel. 3. Hold this position for __________ seconds. 4. Let your muscles relax completely before you repeat this exercise. Repeat __________ times. Complete this exercise __________ times a day. Alternating arm and leg raises  1. Get on your hands and knees on a firm surface. If you are on a hard floor, you may want to use padding, such as an exercise mat, to cushion your knees. 2. Line up your arms and legs. Your hands should be directly below your shoulders, and your  knees should be directly below your hips. 3. Lift your left leg behind you. At the same time, raise your right arm and straighten it in front of you. ? Do not lift your leg higher than your hip. ? Do not lift your arm higher than your shoulder. ? Keep your abdominal and back muscles tight. ? Keep your hips facing the ground. ? Do not arch your back. ? Keep your balance carefully, and do not hold your breath. 4. Hold this position for __________ seconds. 5. Slowly return to the starting position. 6. Repeat with your right leg and your left arm. Repeat __________ times. Complete this exercise __________ times a day. Abdominal set with straight leg raise  1. Lie on your back on a firm surface. 2. Bend one of your knees and keep your other leg straight. 3. Tense your abdominal muscles and lift your straight leg up, 4-6 inches (10-15 cm) off the ground. 4. Keep your abdominal muscles tight and hold this position for __________ seconds. ? Do not hold your breath. ? Do not arch your back. Keep it flat against the ground. 5. Keep your abdominal muscles tense as you slowly lower your leg back to the starting position. 6. Repeat with your other leg. Repeat __________ times. Complete this exercise __________ times a day. Single leg lower with bent knees 1. Lie on your back on a firm surface. 2. Tense your abdominal muscles and lift your feet off the floor, one foot at a time, so your knees and hips are bent in 90-degree angles (right angles). ? Your knees should be over your hips and your lower legs should be parallel to the floor. 3. Keeping your abdominal muscles tense and your knee bent, slowly lower one of your legs so your toe touches the ground. 4. Lift your leg back up to return to the starting position. ? Do not hold your breath. ? Do not let your back arch. Keep your back flat against the ground. 5. Repeat with your other leg. Repeat __________ times. Complete this exercise __________  times a day. Posture and body mechanics Good posture and healthy body mechanics can help to relieve stress in your body's tissues and joints. Body mechanics refers to the movements and positions of your body while you do your daily activities. Posture is part of body mechanics. Good posture means:  Your spine is in its natural S-curve position (neutral).  Your shoulders are pulled back slightly.  Your head is not tipped forward. Follow these guidelines to improve your posture and body mechanics in your everyday activities. Standing   When standing, keep your spine neutral and your feet about hip width apart. Keep a slight bend in your knees. Your ears, shoulders, and hips should line up.  When you do a task in which you stand in one place for a long time, place one foot up on a stable object that is 2-4 inches (5-10 cm) high, such as a  footstool. This helps keep your spine neutral. Sitting   When sitting, keep your spine neutral and keep your feet flat on the floor. Use a footrest, if necessary, and keep your thighs parallel to the floor. Avoid rounding your shoulders, and avoid tilting your head forward.  When working at a desk or a computer, keep your desk at a height where your hands are slightly lower than your elbows. Slide your chair under your desk so you are close enough to maintain good posture.  When working at a computer, place your monitor at a height where you are looking straight ahead and you do not have to tilt your head forward or downward to look at the screen. Resting  When lying down and resting, avoid positions that are most painful for you.  If you have pain with activities such as sitting, bending, stooping, or squatting, lie in a position in which your body does not bend very much. For example, avoid curling up on your side with your arms and knees near your chest (fetal position).  If you have pain with activities such as standing for a long time or reaching  with your arms, lie with your spine in a neutral position and bend your knees slightly. Try the following positions: ? Lying on your side with a pillow between your knees. ? Lying on your back with a pillow under your knees. Lifting   When lifting objects, keep your feet at least shoulder width apart and tighten your abdominal muscles.  Bend your knees and hips and keep your spine neutral. It is important to lift using the strength of your legs, not your back. Do not lock your knees straight out.  Always ask for help to lift heavy or awkward objects. This information is not intended to replace advice given to you by your health care provider. Make sure you discuss any questions you have with your health care provider. Document Revised: 07/20/2018 Document Reviewed: 04/19/2018 Elsevier Patient Education  2020 [employer].     Health Maintenance, Female Adopting a healthy lifestyle and getting preventive care are important in promoting health and wellness. Ask your health care provider about:  The right schedule for you to have regular tests and exams.  Things you can do on your own to prevent diseases and keep yourself healthy. What should I know about diet, weight, and exercise? Eat a healthy diet   Eat a diet that includes plenty of vegetables, fruits, low-fat dairy products, and lean protein.  Do not eat a lot of foods that are high in solid fats, added sugars, or sodium. Maintain a healthy weight Body mass index (BMI) is used to identify weight problems. It estimates body fat based on height and weight. Your health care provider can help determine your BMI and help you achieve or maintain a healthy weight. Get regular exercise Get regular exercise. This is one of the most important things you can do for your health. Most adults should:  Exercise for at least 150 minutes each week. The exercise should increase your heart rate and make you sweat (moderate-intensity  exercise).  Do strengthening exercises at least twice a week. This is in addition to the moderate-intensity exercise.  Spend less time sitting. Even light physical activity can be beneficial. Watch cholesterol and blood lipids Have your blood tested for lipids and cholesterol at 59 years of age, then have this test every 5 years. Have your cholesterol levels checked more often if:  Your lipid or  cholesterol levels are high.  You are older than 59 years of age.  You are at high risk for heart disease. What should I know about cancer screening? Depending on your health history and family history, you may need to have cancer screening at various ages. This may include screening for:  Breast cancer.  Cervical cancer.  Colorectal cancer.  Skin cancer.  Lung cancer. What should I know about heart disease, diabetes, and high blood pressure? Blood pressure and heart disease  High blood pressure causes heart disease and increases the risk of stroke. This is more likely to develop in people who have high blood pressure readings, are of African descent, or are overweight.  Have your blood pressure checked: ? Every 3-5 years if you are 26-34 years of age. ? Every year if you are 81 years old or older. Diabetes Have regular diabetes screenings. This checks your fasting blood sugar level. Have the screening done:  Once every three years after age 102 if you are at a normal weight and have a low risk for diabetes.  More often and at a younger age if you are overweight or have a high risk for diabetes. What should I know about preventing infection? Hepatitis B If you have a higher risk for hepatitis B, you should be screened for this virus. Talk with your health care provider to find out if you are at risk for hepatitis B infection. Hepatitis C Testing is recommended for:  Everyone born from [birth year] through 1965.  Anyone with known risk factors for hepatitis C. Sexually transmitted  infections (STIs)  Get screened for STIs, including gonorrhea and chlamydia, if: ? You are sexually active and are younger than 59 years of age. ? You are older than 59 years of age and your health care provider tells you that you are at risk for this type of infection. ? Your sexual activity has changed since you were last screened, and you are at increased risk for chlamydia or gonorrhea. Ask your health care provider if you are at risk.  Ask your health care provider about whether you are at high risk for HIV. Your health care provider may recommend a prescription medicine to help prevent HIV infection. If you choose to take medicine to prevent HIV, you should first get tested for HIV. You should then be tested every 3 months for as long as you are taking the medicine. Pregnancy  If you are about to stop having your period (premenopausal) and you may become pregnant, seek counseling before you get pregnant.  Take 400 to 800 micrograms (mcg) of folic acid every day if you become pregnant.  Ask for birth control (contraception) if you want to prevent pregnancy. Osteoporosis and menopause Osteoporosis is a disease in which the bones lose minerals and strength with aging. This can result in bone fractures. If you are 5 years old or older, or if you are at risk for osteoporosis and fractures, ask your health care provider if you should:  Be screened for bone loss.  Take a calcium or vitamin D supplement to lower your risk of fractures.  Be given hormone replacement therapy (HRT) to treat symptoms of menopause. Follow these instructions at home: Lifestyle  Do not use any products that contain nicotine or tobacco, such as cigarettes, e-cigarettes, and chewing tobacco. If you need help quitting, ask your health care provider.  Do not use street drugs.  Do not share needles.  Ask your health care provider for help  if you need support or information about quitting drugs. Alcohol use  Do  not drink alcohol if: ? Your health care provider tells you not to drink. ? You are pregnant, may be pregnant, or are planning to become pregnant.  If you drink alcohol: ? Limit how much you use to 0-1 drink a day. ? Limit intake if you are breastfeeding.  Be aware of how much alcohol is in your drink. In the [place] one drink equals one 12 oz bottle of beer (355 mL), one 5 oz glass of wine (148 mL), or one 1 oz glass of hard liquor (44 mL). General instructions  Schedule regular health, dental, and eye exams.  Stay current with your vaccines.  Tell your health care provider if: ? You often feel depressed. ? You have ever been abused or do not feel safe at home. Summary  Adopting a healthy lifestyle and getting preventive care are important in promoting health and wellness.  Follow your health care provider's instructions about healthy diet, exercising, and getting tested or screened for diseases.  Follow your health care provider's instructions on monitoring your cholesterol and blood pressure. This information is not intended to replace advice given to you by your health care provider. Make sure you discuss any questions you have with your health care provider. Document Revised: 03/21/2018 Document Reviewed: 03/21/2018 Elsevier Patient Education  2020 [employer]

## 2019-08-13 ENCOUNTER — Encounter: Payer: Self-pay | Admitting: Internal Medicine

## 2019-08-13 ENCOUNTER — Ambulatory Visit (INDEPENDENT_AMBULATORY_CARE_PROVIDER_SITE_OTHER): Payer: BC Managed Care – PPO | Admitting: Internal Medicine

## 2019-08-13 ENCOUNTER — Other Ambulatory Visit: Payer: Self-pay

## 2019-08-13 VITALS — BP 132/76 | HR 91 | Temp 98.1°F | Resp 16 | Ht 65.0 in | Wt 130.0 lb

## 2019-08-13 DIAGNOSIS — M85851 Other specified disorders of bone density and structure, right thigh: Secondary | ICD-10-CM

## 2019-08-13 DIAGNOSIS — R739 Hyperglycemia, unspecified: Secondary | ICD-10-CM | POA: Diagnosis not present

## 2019-08-13 DIAGNOSIS — E782 Mixed hyperlipidemia: Secondary | ICD-10-CM

## 2019-08-13 DIAGNOSIS — S39012A Strain of muscle, fascia and tendon of lower back, initial encounter: Secondary | ICD-10-CM

## 2019-08-13 DIAGNOSIS — Z0000 Encounter for general adult medical examination without abnormal findings: Secondary | ICD-10-CM | POA: Diagnosis not present

## 2019-08-13 DIAGNOSIS — T783XXA Angioneurotic edema, initial encounter: Secondary | ICD-10-CM

## 2019-08-13 LAB — CBC WITH DIFFERENTIAL/PLATELET
Basophils Absolute: 0 10*3/uL (ref 0.0–0.1)
Basophils Relative: 0.3 % (ref 0.0–3.0)
Eosinophils Absolute: 0 10*3/uL (ref 0.0–0.7)
Eosinophils Relative: 0.1 % (ref 0.0–5.0)
HCT: 42.7 % (ref 36.0–46.0)
Hemoglobin: 14.6 g/dL (ref 12.0–15.0)
Lymphocytes Relative: 9.4 % — ABNORMAL LOW (ref 12.0–46.0)
Lymphs Abs: 1 10*3/uL (ref 0.7–4.0)
MCHC: 34.1 g/dL (ref 30.0–36.0)
MCV: 93.7 fl (ref 78.0–100.0)
Monocytes Absolute: 0.3 10*3/uL (ref 0.1–1.0)
Monocytes Relative: 3.1 % (ref 3.0–12.0)
Neutro Abs: 9.5 10*3/uL — ABNORMAL HIGH (ref 1.4–7.7)
Neutrophils Relative %: 87.1 % — ABNORMAL HIGH (ref 43.0–77.0)
Platelets: 310 10*3/uL (ref 150.0–400.0)
RBC: 4.56 Mil/uL (ref 3.87–5.11)
RDW: 13.3 % (ref 11.5–15.5)
WBC: 10.9 10*3/uL — ABNORMAL HIGH (ref 4.0–10.5)

## 2019-08-13 LAB — COMPREHENSIVE METABOLIC PANEL
ALT: 21 U/L (ref 0–35)
AST: 22 U/L (ref 0–37)
Albumin: 4.5 g/dL (ref 3.5–5.2)
Alkaline Phosphatase: 60 U/L (ref 39–117)
BUN: 22 mg/dL (ref 6–23)
CO2: 29 mEq/L (ref 19–32)
Calcium: 9.6 mg/dL (ref 8.4–10.5)
Chloride: 101 mEq/L (ref 96–112)
Creatinine, Ser: 0.79 mg/dL (ref 0.40–1.20)
GFR: 74.62 mL/min (ref >60.00)
Glucose, Bld: 115 mg/dL — ABNORMAL HIGH (ref 70–99)
Potassium: 3.6 mEq/L (ref 3.5–5.1)
Sodium: 137 mEq/L (ref 135–145)
Total Bilirubin: 0.9 mg/dL (ref 0.2–1.2)
Total Protein: 7.1 g/dL (ref 6.0–8.3)

## 2019-08-13 LAB — LIPID PANEL
Cholesterol: 223 mg/dL — ABNORMAL HIGH (ref 0–200)
HDL: 65.5 mg/dL (ref >39.00)
LDL Cholesterol: 134 mg/dL — ABNORMAL HIGH (ref 0–99)
NonHDL: 157.69
Total CHOL/HDL Ratio: 3
Triglycerides: 117 mg/dL (ref 0.0–149.0)
VLDL: 23.4 mg/dL (ref 0.0–40.0)

## 2019-08-13 LAB — TSH: TSH: 2.3 u[IU]/mL (ref 0.35–4.50)

## 2019-08-13 LAB — VITAMIN D 25 HYDROXY (VIT D DEFICIENCY, FRACTURES): VITD: 55.52 ng/mL (ref 30.00–100.00)

## 2019-08-13 LAB — HEMOGLOBIN A1C: Hgb A1c MFr Bld: 5.3 % (ref 4.6–6.5)

## 2019-08-13 MED ORDER — EPINEPHRINE 0.3 MG/0.3ML IJ SOAJ: 0.3000 mg | INTRAMUSCULAR | 3 refills | Status: DC | PRN

## 2019-08-13 NOTE — Assessment & Plan Note (Signed)
Chronic Check a1c Low sugar / carb diet Stressed regular exercise  

## 2019-08-13 NOTE — Assessment & Plan Note (Signed)
Chronic Osteopenia multiple sites dexa due- ordered for [facility] center Walking, yoga for exercise Taking MVI Check vitamin d level

## 2019-08-13 NOTE — Assessment & Plan Note (Signed)
Acute Right lower back pain, some occ burning in right thigh Went to urgent care over the weekend On prednisone, flexeril Improving Avoid bending, lifting, twisting Back stretches

## 2019-08-13 NOTE — Assessment & Plan Note (Signed)
Chronic Related to environmental exposures, no recent episodes Take allegra daily Epi-pen prn

## 2019-08-13 NOTE — Assessment & Plan Note (Signed)
Chronic Check lipid panel, cmp,tsh  Diet controlled Regular exercise and healthy diet encouraged  

## 2019-08-15 ENCOUNTER — Encounter: Payer: Self-pay | Admitting: Internal Medicine

## 2019-08-20 ENCOUNTER — Encounter: Payer: Self-pay | Admitting: Internal Medicine

## 2019-08-28 DIAGNOSIS — Z03818 Encounter for observation for suspected exposure to other biological agents ruled out: Secondary | ICD-10-CM | POA: Diagnosis not present

## 2019-08-29 ENCOUNTER — Ambulatory Visit: Payer: BC Managed Care – PPO | Attending: Internal Medicine

## 2019-08-29 DIAGNOSIS — Z23 Encounter for immunization: Secondary | ICD-10-CM

## 2019-08-29 NOTE — Progress Notes (Signed)
   Covid-19 Vaccination Clinic  Name:  Tamiya Colello    MRN: [id number] DOB: 1961/02/02  08/29/2019  Ms. Gatt was observed post Covid-19 immunization for 30 minutes based on pre-vaccination screening without incident. She was provided with Vaccine Information Sheet and instruction to access the V-Safe system.   Ms. Avery was instructed to call 911 with any severe reactions post vaccine: Marland Kitchen Difficulty breathing  . Swelling of face and throat  . A fast heartbeat  . A bad rash all over body  . Dizziness and weakness   Immunizations Administered    Name Date Dose VIS Date Route   Pfizer COVID-19 Vaccine 08/29/2019  8:13 AM 0.3 mL 06/05/2018 Intramuscular   Manufacturer: [employer], [employer]   Lot: [id number]   NDC: [address]-7

## 2019-09-04 DIAGNOSIS — Z03818 Encounter for observation for suspected exposure to other biological agents ruled out: Secondary | ICD-10-CM | POA: Diagnosis not present

## 2019-09-05 DIAGNOSIS — M9905 Segmental and somatic dysfunction of pelvic region: Secondary | ICD-10-CM | POA: Diagnosis not present

## 2019-09-05 DIAGNOSIS — M9906 Segmental and somatic dysfunction of lower extremity: Secondary | ICD-10-CM | POA: Diagnosis not present

## 2019-09-05 DIAGNOSIS — M9903 Segmental and somatic dysfunction of lumbar region: Secondary | ICD-10-CM | POA: Diagnosis not present

## 2019-09-05 DIAGNOSIS — M9904 Segmental and somatic dysfunction of sacral region: Secondary | ICD-10-CM | POA: Diagnosis not present

## 2019-09-16 DIAGNOSIS — M9905 Segmental and somatic dysfunction of pelvic region: Secondary | ICD-10-CM | POA: Diagnosis not present

## 2019-09-16 DIAGNOSIS — M9904 Segmental and somatic dysfunction of sacral region: Secondary | ICD-10-CM | POA: Diagnosis not present

## 2019-09-16 DIAGNOSIS — M9903 Segmental and somatic dysfunction of lumbar region: Secondary | ICD-10-CM | POA: Diagnosis not present

## 2019-09-16 DIAGNOSIS — M9906 Segmental and somatic dysfunction of lower extremity: Secondary | ICD-10-CM | POA: Diagnosis not present

## 2019-09-23 ENCOUNTER — Ambulatory Visit: Payer: BC Managed Care – PPO | Attending: Internal Medicine

## 2019-09-23 DIAGNOSIS — Z23 Encounter for immunization: Secondary | ICD-10-CM

## 2019-09-23 NOTE — Progress Notes (Signed)
   Covid-19 Vaccination Clinic  Name:  Jenny Haley    MRN: [id number] DOB: 1960-11-18  09/23/2019  [name] was observed post Covid-19 immunization for 30 minutes based on pre-vaccination screening without incident. She was provided with Vaccine Information Sheet and instruction to access the V-Safe system.   [name] was instructed to call 911 with any severe reactions post vaccine: Marland Kitchen Difficulty breathing  . Swelling of face and throat  . A fast heartbeat  . A bad rash all over body  . Dizziness and weakness   Immunizations Administered    Name Date Dose VIS Date Route   Pfizer COVID-19 Vaccine 09/23/2019  8:07 AM 0.3 mL 06/05/2018 Intramuscular   Manufacturer: [employer], [employer]   Lot: [id number]   NDC: [address]-3

## 2019-10-28 DIAGNOSIS — M9905 Segmental and somatic dysfunction of pelvic region: Secondary | ICD-10-CM | POA: Diagnosis not present

## 2019-10-28 DIAGNOSIS — M9906 Segmental and somatic dysfunction of lower extremity: Secondary | ICD-10-CM | POA: Diagnosis not present

## 2019-10-28 DIAGNOSIS — M9904 Segmental and somatic dysfunction of sacral region: Secondary | ICD-10-CM | POA: Diagnosis not present

## 2019-10-28 DIAGNOSIS — M9903 Segmental and somatic dysfunction of lumbar region: Secondary | ICD-10-CM | POA: Diagnosis not present

## 2019-10-29 ENCOUNTER — Ambulatory Visit
Admission: RE | Admit: 2019-10-29 | Discharge: 2019-10-29 | Disposition: A | Discharge status: Home / Self Care | Payer: BC Managed Care – PPO | Source: Ambulatory Visit | Attending: Internal Medicine | Admitting: Internal Medicine

## 2019-10-29 ENCOUNTER — Other Ambulatory Visit: Payer: Self-pay

## 2019-10-29 DIAGNOSIS — M810 Age-related osteoporosis without current pathological fracture: Secondary | ICD-10-CM | POA: Diagnosis not present

## 2019-10-29 DIAGNOSIS — M85851 Other specified disorders of bone density and structure, right thigh: Secondary | ICD-10-CM | POA: Diagnosis not present

## 2019-10-29 DIAGNOSIS — Z780 Asymptomatic menopausal state: Secondary | ICD-10-CM | POA: Diagnosis not present

## 2019-10-31 ENCOUNTER — Encounter: Payer: Self-pay | Admitting: Internal Medicine

## 2019-11-12 NOTE — Progress Notes (Signed)
Subjective:    Patient ID: Jenny Haley, female    DOB: [birth year], 59 y.o.   MRN: [id number]  HPI The patient is here for follow up of her bone density   The BMD measured at [facility] L1-L4 (L3) is 0.843 g/cm2 with a T-score of -2.7.  This patient is considered OSTEOPOROTIC according to World Health Organization [facility]) criteria.  L-3 was excluded due to degenerative changes.  Site Region Measured Date Measured Age YA T-score BMD Significant CHANGE  [facility]  L1-L4 (L3)  10/29/2019    58.6         -2.7    0.843 g/cm2  DualFemur Total Right 10/29/2019    58.6         -1.6    0.807 g/cm2  DualFemur Total Mean  10/29/2019    58.6         -1.4    0.829 g/cm2   She walks at least two miles a day and does yoga.  She is taking a multivitamin daily and this has a very small amount of calcium in it. She is not taking any separate calcium or vitamin D. Her vitamin D level 2 months ago was very good.  Her father had a history of osteoporosis and did take medication for this.  Medications and allergies reviewed with patient and updated if appropriate.  Patient Active Problem List   Diagnosis Date Noted  . Lumbar strain, initial encounter 08/13/2019  . Hyperglycemia 09/21/2016  . Osteoporosis 09/26/2015  . Hyperlipidemia 08/18/2015  . Allergic rhinitis 06/09/2011  . MVP (mitral valve prolapse) 08/25/2009  . Angioedema 08/25/2009    Current Outpatient Medications on File Prior to Visit  Medication Sig Dispense Refill  . EPINEPHrine 0.3 mg/0.3 mL IJ SOAJ injection Inject 0.3 mLs (0.3 mg total) into the muscle as needed. 1 each 3  . Fexofenadine HCl (ALLEGRA ALLERGY PO) Take by mouth as needed.    . Multiple Vitamin (MULTI VITAMIN DAILY PO) Take by mouth.    . Red Yeast Rice Extract (RED YEAST RICE PO) Take by mouth.     No current facility-administered medications on file prior to visit.    Past Medical History:  Diagnosis Date  . Allergy   . Angioedema     eval by rheum and allergy dr Irena Cords now  . Chicken pox   . H/O angioedema    neg rheum pos rf dr Dareen Piano 2011  . Infertility   . Mitral valve prolapse    echo years ago fainting in childhood   . Osteopenia     Past Surgical History:  Procedure Laterality Date  . LAPAROSCOPIC ABDOMINAL EXPLORATION  1992   Found minimal endometriosis  . WISDOM TOOTH EXTRACTION      Social History   Socioeconomic History  . Marital status: Married    Spouse name: Not on file  . Number of children: Not on file  . Years of education: Not on file  . Highest education level: Not on file  Occupational History  . Not on file  Tobacco Use  . Smoking status: Never Smoker  . Smokeless tobacco: Never Used  Vaping Use  . Vaping Use: Never used  Substance and Sexual Activity  . Alcohol use: No  . Drug use: No  . Sexual activity: Yes    Birth control/protection: Post-menopausal, Surgical  Other Topics Concern  . Not on file  Social History Narrative   Sleeps about 6-7 hours of sleep per  night (thanks to menopause)   2 people living in the home   Has a small dog in the home    p0 g0   No tobacco ets    Husband IT  [employer]  Caring for parents    Neg tad      Exercises - walks 4 miles, bikes 10 miles   Social Determinants of Health   Financial Resource Strain:   . Difficulty of Paying Living Expenses:   Food Insecurity:   . Worried About Programme researcher, broadcasting/film/video in the Last Year:   . Barista in the Last Year:   Transportation Needs:   . Freight forwarder (Medical):   Marland Kitchen Lack of Transportation (Non-Medical):   Physical Activity:   . Days of Exercise per Week:   . Minutes of Exercise per Session:   Stress:   . Feeling of Stress :   Social Connections:   . Frequency of Communication with Friends and Family:   . Frequency of Social Gatherings with Friends and Family:   . Attends Religious Services:   . Active Member of Clubs or Organizations:     . Attends Banker Meetings:   Marland Kitchen Marital Status:     Family History  Problem Relation Age of Onset  . Hyperlipidemia Mother   . Hypertension Mother   . Arthritis Mother        hands   . Endometrial cancer Mother 28       surgery, radiation  . Vaginal cancer Mother   . Esophageal cancer Father        Esophageal with metastisis to the liver  . Polycystic kidney disease Brother        congental cr 5 range, s/p transplant  . Hypertension Brother   . Colon cancer Neg Hx   . Rectal cancer Neg Hx   . Stomach cancer Neg Hx     Review of Systems     Objective:   Vitals:   11/13/19 0921  BP: 120/78  Pulse: 77  Temp: 98 F (36.7 C)  SpO2: 99%   BP Readings from Last 3 Encounters:  11/13/19 120/78  08/13/19 132/76  12/06/18 108/66   Wt Readings from Last 3 Encounters:  11/13/19 132 lb (59.9 kg)  08/13/19 130 lb (59 kg)  12/06/18 129 lb 12.8 oz (58.9 kg)   Body mass index is 21.97 kg/m.   Physical Exam       Assessment & Plan:    25 minutes were spent face-to-face with the patient, over 50% of which was spent counseling regarding her new diagnosis of osteoporosis. Discussed causes of osteoporosis.  We discussed treatment with calcium/vitamin d, exercise and medication options including fosamax, prolia, reclast and forteo.  Each medication was discussed including the possible side effects.  See Problem List for Assessment and Plan of chronic medical problems.    This visit occurred during the SARS-CoV-2 public health emergency.  Safety protocols were in place, including screening questions prior to the visit, additional usage of staff PPE, and extensive cleaning of exam room while observing appropriate contact time as indicated for disinfecting solutions.

## 2019-11-13 ENCOUNTER — Encounter: Payer: Self-pay | Admitting: Internal Medicine

## 2019-11-13 ENCOUNTER — Other Ambulatory Visit: Payer: Self-pay

## 2019-11-13 ENCOUNTER — Ambulatory Visit: Payer: BC Managed Care – PPO | Admitting: Internal Medicine

## 2019-11-13 VITALS — BP 120/78 | HR 77 | Temp 98.0°F | Ht 65.0 in | Wt 132.0 lb

## 2019-11-13 DIAGNOSIS — M810 Age-related osteoporosis without current pathological fracture: Secondary | ICD-10-CM

## 2019-11-13 NOTE — Assessment & Plan Note (Signed)
New diagnosis Bone density shows that she has osteoporosis of her spine We reviewed her bone density in detail Advised increasing calcium, continuing vitamin D-may switch to a bone vitamin that also contains magnesium, vitamin K Continue walking daily and doing yoga. Can add some resistant weights Discussed medication options including Fosamax, Reclast, Prolia and medication such as Forteo She denies GERD history Would be a good candidate for Fosamax. Her father did have side effects from this, but she would still consider trying it. Advised we would do this for 5 years She has an upcoming GYN appointment and will discuss with her gynecologist as well DEXA in 2 years

## 2019-11-13 NOTE — Patient Instructions (Addendum)
Take a bone strength vitamin.   Keep walking and doing yoga   Talk to your GYN about this as well.    Osteoporosis  Osteoporosis is thinning and loss of density in your bones. Osteoporosis makes bones more brittle and fragile and more likely to break (fracture). Over time, osteoporosis can cause your bones to become so weak that they fracture after a minor fall. Bones in the hip, wrist, and spine are most likely to fracture due to osteoporosis. What are the causes? The exact cause of this condition is not known. What increases the risk? You may be at greater risk for osteoporosis if you:  Have a family history of the condition.  Have poor nutrition.  Use steroid medicines, such as prednisone.  Are female.  Are age 2 or older.  Smoke or have a history of smoking.  Are not physically active (are sedentary).  Are white (Caucasian) or of Asian descent.  Have a small body frame.  Take certain medicines, such as antiseizure medicines. What are the signs or symptoms? A fracture might be the first sign of osteoporosis, especially if the fracture results from a fall or injury that usually would not cause a bone to break. Other signs and symptoms include:  Pain in the neck or low back.  Stooped posture.  Loss of height. How is this diagnosed? This condition may be diagnosed based on:  Your medical history.  A physical exam.  A bone mineral density test, also called a DXA or DEXA test (dual-energy X-ray absorptiometry test). This test uses X-rays to measure the amount of minerals in your bones. How is this treated? The goal of treatment is to strengthen your bones and lower your risk for a fracture. Treatment may involve:  Making lifestyle changes, such as: ? Including foods with more calcium and vitamin D in your diet. ? Doing weight-bearing and muscle-strengthening exercises. ? Stopping tobacco use. ? Limiting alcohol intake.  Taking medicine to slow the process  of bone loss or to increase bone density.  Taking daily supplements of calcium and vitamin D.  Taking hormone replacement medicines, such as estrogen for women and testosterone for men.  Monitoring your levels of calcium and vitamin D. Follow these instructions at home:  Activity  Exercise as told by your health care provider. Ask your health care provider what exercises and activities are safe for you. You should do: ? Exercises that make you work against gravity (weight-bearing exercises), such as tai chi, yoga, or walking. ? Exercises to strengthen muscles, such as lifting weights. Lifestyle  Limit alcohol intake to no more than 1 drink a day for nonpregnant women and 2 drinks a day for men. One drink equals 12 oz of beer, 5 oz of wine, or 1 oz of hard liquor.  Do not use any products that contain nicotine or tobacco, such as cigarettes and e-cigarettes. If you need help quitting, ask your health care provider. Preventing falls  Use devices to help you move around (mobility aids) as needed, such as canes, walkers, scooters, or crutches.  Keep rooms well-lit and clutter-free.  Remove tripping hazards from walkways, including cords and throw rugs.  Install grab bars in bathrooms and safety rails on stairs.  Use rubber mats in the bathroom and other areas that are often wet or slippery.  Wear closed-toe shoes that fit well and support your feet. Wear shoes that have rubber soles or low heels.  Review your medicines with your health care provider. Some  medicines can cause dizziness or changes in blood pressure, which can increase your risk of falling. General instructions  Include calcium and vitamin D in your diet. Calcium is important for bone health, and vitamin D helps your body to absorb calcium. Good sources of calcium and vitamin D include: ? Certain fatty fish, such as salmon and tuna. ? Products that have calcium and vitamin D added to them (fortified products), such as  fortified cereals. ? Egg yolks. ? Cheese. ? Liver.  Take over-the-counter and prescription medicines only as told by your health care provider.  Keep all follow-up visits as told by your health care provider. This is important. Contact a health care provider if:  You have never been screened for osteoporosis and you are: ? A woman who is age 72 or older. ? A man who is age 12 or older. Get help right away if:  You fall or injure yourself. Summary  Osteoporosis is thinning and loss of density in your bones. This makes bones more brittle and fragile and more likely to break (fracture),even with minor falls.  The goal of treatment is to strengthen your bones and reduce your risk for a fracture.  Include calcium and vitamin D in your diet. Calcium is important for bone health, and vitamin D helps your body to absorb calcium.  Talk with your health care provider about screening for osteoporosis if you are a woman who is age 12 or older, or a man who is age 43 or older. This information is not intended to replace advice given to you by your health care provider. Make sure you discuss any questions you have with your health care provider. Document Revised: 03/10/2017 Document Reviewed: 01/20/2017 Elsevier Patient Education  2020 [employer].

## 2019-12-09 DIAGNOSIS — M9903 Segmental and somatic dysfunction of lumbar region: Secondary | ICD-10-CM | POA: Diagnosis not present

## 2019-12-09 DIAGNOSIS — M9906 Segmental and somatic dysfunction of lower extremity: Secondary | ICD-10-CM | POA: Diagnosis not present

## 2019-12-09 DIAGNOSIS — M9905 Segmental and somatic dysfunction of pelvic region: Secondary | ICD-10-CM | POA: Diagnosis not present

## 2019-12-09 DIAGNOSIS — M9904 Segmental and somatic dysfunction of sacral region: Secondary | ICD-10-CM | POA: Diagnosis not present

## 2019-12-09 NOTE — Progress Notes (Signed)
59 y.o. G0P0001 Married White or Caucasian Not Hispanic or Latino female here for annual exam. She would like to talk about her bone density scan.      DEXA on 10/29/19 with her primary. Osteoporosis with T score of -2.7 in her spine. She saw Dr Lawerance Bach last month and discussed treatment options.   H/O vaginal atrophy and a stenotic, friable cervix. No vaginal bleeding. Sexually active, some ED, no pain. Husband with stage 0 melanoma diagnosed last year.   In the last year her Mom broke her hip, broke her neck (in a C collar), in assisted living. Very stressful.   No LMP recorded. Patient is postmenopausal.          Sexually active: Yes.    The current method of family planning is post menopausal status.    Exercising: Yes.    Walking  Smoker:  no  Health Maintenance: Pap: 12/04/2017 WNL NEG HPV  History of abnormal Pap:  no MMG:  05/15/19 density C Bi-rads 1 neg  BMD:   10/30/19 osteoporotic  Colonoscopy: 05/02/13 f/u 10 years  TDaP:  08/18/15 Gardasil: N/a   reports that she has never smoked. She has never used smokeless tobacco. She reports that she does not drink alcohol and does not use drugs. Son is local, 28.  Past Medical History:  Diagnosis Date  . Allergy   . Angioedema    eval by rheum and allergy dr Irena Cords now  . Chicken pox   . H/O angioedema    neg rheum pos rf dr Dareen Piano 2011  . Infertility   . Mitral valve prolapse    echo years ago fainting in childhood   . Osteopenia     Past Surgical History:  Procedure Laterality Date  . LAPAROSCOPIC ABDOMINAL EXPLORATION  1992   Found minimal endometriosis  . WISDOM TOOTH EXTRACTION      Current Outpatient Medications  Medication Sig Dispense Refill  . EPINEPHrine 0.3 mg/0.3 mL IJ SOAJ injection Inject 0.3 mLs (0.3 mg total) into the muscle as needed. 1 each 3  . Fexofenadine HCl (ALLEGRA ALLERGY PO) Take by mouth as needed.    . Red Yeast Rice Extract (RED YEAST RICE PO) Take by mouth.    Marland Kitchen UNABLE TO FIND Take 480  mg by mouth daily. [facility].     No current facility-administered medications for this visit.    Family History  Problem Relation Age of Onset  . Hyperlipidemia Mother   . Hypertension Mother   . Arthritis Mother        hands   . Endometrial cancer Mother 40       surgery, radiation  . Vaginal cancer Mother   . Esophageal cancer Father        Esophageal with metastisis to the liver  . Polycystic kidney disease Brother        congental cr 5 range, s/p transplant  . Hypertension Brother   . Colon cancer Neg Hx   . Rectal cancer Neg Hx   . Stomach cancer Neg Hx     Review of Systems  All other systems reviewed and are negative.   Exam:   BP 122/68 (BP Location: Right Arm, Patient Position: Sitting, Cuff Size: Normal)   Pulse 85   Ht 5\' 5"  (1.651 m)   Wt 132 lb 3.2 oz (60 kg)   SpO2 97%   BMI 22.00 kg/m   Weight change: @WEIGHTCHANGE @ Height:   Height: 5\' 5"  (  165.1 cm)  Ht Readings from Last 3 Encounters:  12/12/19 5\' 5"  (1.651 m)  11/13/19 5\' 5"  (1.651 m)  08/13/19 5\' 5"  (1.651 m)    General appearance: alert, cooperative and appears stated age Head: Normocephalic, without obvious abnormality, atraumatic Neck: no adenopathy, supple, symmetrical, trachea midline and thyroid normal to inspection and palpation Lungs: clear to auscultation bilaterally Cardiovascular: regular rate and rhythm Breasts: normal appearance, no masses or tenderness Abdomen: soft, non-tender; non distended,  no masses,  no organomegaly Extremities: extremities normal, atraumatic, no cyanosis or edema Skin: Skin color, texture, turgor normal. No rashes or lesions Lymph nodes: Cervical, supraclavicular, and axillary nodes normal. No abnormal inguinal nodes palpated Neurologic: Grossly normal   Pelvic: External genitalia:  no lesions              Urethra:  normal appearing urethra with no masses, tenderness or lesions              Bartholins and Skenes: normal                  Vagina: atrophic appearing vagina with normal color and discharge, no lesions              Cervix: no lesions, flush with vagina               Bimanual Exam:  Uterus:  normal size, contour, position, consistency, mobility, non-tender              Adnexa: no mass, fullness, tenderness               Rectovaginal: Confirms               Anus:  normal sphincter tone, no lesions  chaperoned for the exam.  A:  Well Woman with normal exam  Recently diagnosed with osteoporosis. Discussed treatment.   P:   No pap this year  Mammogram UTD  Colonoscopy UTD  Labs UTD with primary  Discussed calicum, vir d and weight bearing exercise  Start Fosamax  F/U DEXA in 7/23   CC: Dr 

## 2019-12-12 ENCOUNTER — Other Ambulatory Visit: Payer: Self-pay

## 2019-12-12 ENCOUNTER — Encounter: Payer: Self-pay | Admitting: Obstetrics and Gynecology

## 2019-12-12 ENCOUNTER — Ambulatory Visit: Payer: BC Managed Care – PPO | Admitting: Obstetrics and Gynecology

## 2019-12-12 VITALS — BP 122/68 | HR 85 | Ht 65.0 in | Wt 132.2 lb

## 2019-12-12 DIAGNOSIS — Z01419 Encounter for gynecological examination (general) (routine) without abnormal findings: Secondary | ICD-10-CM | POA: Diagnosis not present

## 2019-12-12 DIAGNOSIS — Z8739 Personal history of other diseases of the musculoskeletal system and connective tissue: Secondary | ICD-10-CM | POA: Diagnosis not present

## 2019-12-12 MED ORDER — ALENDRONATE SODIUM 70 MG PO TABS
70.0000 mg | ORAL_TABLET | ORAL | 3 refills | Status: DC
Start: 2019-12-12 — End: 2020-12-11

## 2019-12-12 NOTE — Patient Instructions (Addendum)
EXERCISE AND DIET:  We recommended that you start or continue a regular exercise program for good health. Regular exercise means any activity that makes your heart beat faster and makes you sweat.  We recommend exercising at least 30 minutes per day at least 3 days a week, preferably 4 or 5.  We also recommend a diet low in fat and sugar.  Inactivity, poor dietary choices and obesity can cause diabetes, heart attack, stroke, and kidney damage, among others.    ALCOHOL AND SMOKING:  Women should limit their alcohol intake to no more than 7 drinks/beers/glasses of wine (combined, not each!) per week. Moderation of alcohol intake to this level decreases your risk of breast cancer and liver damage. And of course, no recreational drugs are part of a healthy lifestyle.  And absolutely no smoking or even second hand smoke. Most people know smoking can cause heart and lung diseases, but did you know it also contributes to weakening of your bones? Aging of your skin?  Yellowing of your teeth and nails?  CALCIUM AND VITAMIN D:  Adequate intake of calcium and Vitamin D are recommended.  The recommendations for exact amounts of these supplements seem to change often, but generally speaking 1,200 mg of calcium (between diet and supplement) and 800 units of Vitamin D per day seems prudent. Certain women may benefit from higher intake of Vitamin D.  If you are among these women, your doctor will have told you during your visit.    PAP SMEARS:  Pap smears, to check for cervical cancer or precancers,  have traditionally been done yearly, although recent scientific advances have shown that most women can have pap smears less often.  However, every woman still should have a physical exam from her gynecologist every year. It will include a breast check, inspection of the vulva and vagina to check for abnormal growths or skin changes, a visual exam of the cervix, and then an exam to evaluate the size and shape of the uterus and  ovaries.  And after 59 years of age, a rectal exam is indicated to check for rectal cancers. We will also provide age appropriate advice regarding health maintenance, like when you should have certain vaccines, screening for sexually transmitted diseases, bone density testing, colonoscopy, mammograms, etc.   MAMMOGRAMS:  All women over 40 years old should have a yearly mammogram. Many facilities now offer a "3D" mammogram, which may cost around $50 extra out of pocket. If possible,  we recommend you accept the option to have the 3D mammogram performed.  It both reduces the number of women who will be called back for extra views which then turn out to be normal, and it is better than the routine mammogram at detecting truly abnormal areas.    COLON CANCER SCREENING: Now recommend starting at age 45. At this time colonoscopy is not covered for routine screening until 50. There are take home tests that can be done between 45-49.   COLONOSCOPY:  Colonoscopy to screen for colon cancer is recommended for all women at age 50.  We know, you hate the idea of the prep.  We agree, BUT, having colon cancer and not knowing it is worse!!  Colon cancer so often starts as a polyp that can be seen and removed at colonscopy, which can quite literally save your life!  And if your first colonoscopy is normal and you have no family history of colon cancer, most women don't have to have it again for   10 years.  Once every ten years, you can do something that may end up saving your life, right?  We will be happy to help you get it scheduled when you are ready.  Be sure to check your insurance coverage so you understand how much it will cost.  It may be covered as a preventative service at no cost, but you should check your particular policy.      Breast Self-Awareness Breast self-awareness means being familiar with how your breasts look and feel. It involves checking your breasts regularly and reporting any changes to your  health care provider. Practicing breast self-awareness is important. A change in your breasts can be a sign of a serious medical problem. Being familiar with how your breasts look and feel allows you to find any problems early, when treatment is more likely to be successful. All women should practice breast self-awareness, including women who have had breast implants. How to do a breast self-exam One way to learn what is normal for your breasts and whether your breasts are changing is to do a breast self-exam. To do a breast self-exam: Look for Changes  1. Remove all the clothing above your waist. 2. Stand in front of a mirror in a room with good lighting. 3. Put your hands on your hips. 4. Push your hands firmly downward. 5. Compare your breasts in the mirror. Look for differences between them (asymmetry), such as: ? Differences in shape. ? Differences in size. ? Puckers, dips, and bumps in one breast and not the other. 6. Look at each breast for changes in your skin, such as: ? Redness. ? Scaly areas. 7. Look for changes in your nipples, such as: ? Discharge. ? Bleeding. ? Dimpling. ? Redness. ? A change in position. Feel for Changes Carefully feel your breasts for lumps and changes. It is best to do this while lying on your back on the floor and again while sitting or standing in the shower or tub with soapy water on your skin. Feel each breast in the following way:  Place the arm on the side of the breast you are examining above your head.  Feel your breast with the other hand.  Start in the nipple area and make  inch (2 cm) overlapping circles to feel your breast. Use the pads of your three middle fingers to do this. Apply light pressure, then medium pressure, then firm pressure. The light pressure will allow you to feel the tissue closest to the skin. The medium pressure will allow you to feel the tissue that is a little deeper. The firm pressure will allow you to feel the tissue  close to the ribs.  Continue the overlapping circles, moving downward over the breast until you feel your ribs below your breast.  Move one finger-width toward the center of the body. Continue to use the  inch (2 cm) overlapping circles to feel your breast as you move slowly up toward your collarbone.  Continue the up and down exam using all three pressures until you reach your armpit.  Write Down What You Find  Write down what is normal for each breast and any changes that you find. Keep a written record with breast changes or normal findings for each breast. By writing this information down, you do not need to depend only on memory for size, tenderness, or location. Write down where you are in your menstrual cycle, if you are still menstruating. If you are having trouble noticing differences   in your breasts, do not get discouraged. With time you will become more familiar with the variations in your breasts and more comfortable with the exam. °How often should I examine my breasts? °Examine your breasts every month. If you are breastfeeding, the best time to examine your breasts is after a feeding or after using a breast pump. If you menstruate, the best time to examine your breasts is 5-7 days after your period is over. During your period, your breasts are lumpier, and it may be more difficult to notice changes. °When should I see my health care provider? °See your health care provider if you notice: °· A change in shape or size of your breasts or nipples. °· A change in the skin of your breast or nipples, such as a reddened or scaly area. °· Unusual discharge from your nipples. °· A lump or thick area that was not there before. °· Pain in your breasts. °· Anything that concerns you. ° ° °Osteoporosis ° °Osteoporosis is thinning and loss of density in your bones. Osteoporosis makes bones more brittle and fragile and more likely to break (fracture). Over time, osteoporosis can cause your bones to become  so weak that they fracture after a minor fall. Bones in the hip, wrist, and spine are most likely to fracture due to osteoporosis. °What are the causes? °The exact cause of this condition is not known. °What increases the risk? °You may be at greater risk for osteoporosis if you: °· Have a family history of the condition. °· Have poor nutrition. °· Use steroid medicines, such as prednisone. °· Are female. °· Are age 50 or older. °· Smoke or have a history of smoking. °· Are not physically active (are sedentary). °· Are white (Caucasian) or of Asian descent. °· Have a small body frame. °· Take certain medicines, such as antiseizure medicines. °What are the signs or symptoms? °A fracture might be the first sign of osteoporosis, especially if the fracture results from a fall or injury that usually would not cause a bone to break. Other signs and symptoms include: °· Pain in the neck or low back. °· Stooped posture. °· Loss of height. °How is this diagnosed? °This condition may be diagnosed based on: °· Your medical history. °· A physical exam. °· A bone mineral density test, also called a DXA or DEXA test (dual-energy X-ray absorptiometry test). This test uses X-rays to measure the amount of minerals in your bones. °How is this treated? °The goal of treatment is to strengthen your bones and lower your risk for a fracture. Treatment may involve: °· Making lifestyle changes, such as: °? Including foods with more calcium and vitamin D in your diet. °? Doing weight-bearing and muscle-strengthening exercises. °? Stopping tobacco use. °? Limiting alcohol intake. °· Taking medicine to slow the process of bone loss or to increase bone density. °· Taking daily supplements of calcium and vitamin D. °· Taking hormone replacement medicines, such as estrogen for women and testosterone for men. °· Monitoring your levels of calcium and vitamin D. °Follow these instructions at home: ° °Activity °· Exercise as told by your health care  provider. Ask your health care provider what exercises and activities are safe for you. You should do: °? Exercises that make you work against gravity (weight-bearing exercises), such as tai chi, yoga, or walking. °? Exercises to strengthen muscles, such as lifting weights. °Lifestyle °· Limit alcohol intake to no more than 1 drink a day for nonpregnant women and   2 drinks a day for men. One drink equals 12 oz of beer, 5 oz of wine, or 1½ oz of hard liquor. °· Do not use any products that contain nicotine or tobacco, such as cigarettes and e-cigarettes. If you need help quitting, ask your health care provider. °Preventing falls °· Use devices to help you move around (mobility aids) as needed, such as canes, walkers, scooters, or crutches. °· Keep rooms well-lit and clutter-free. °· Remove tripping hazards from walkways, including cords and throw rugs. °· Install grab bars in bathrooms and safety rails on stairs. °· Use rubber mats in the bathroom and other areas that are often wet or slippery. °· Wear closed-toe shoes that fit well and support your feet. Wear shoes that have rubber soles or low heels. °· Review your medicines with your health care provider. Some medicines can cause dizziness or changes in blood pressure, which can increase your risk of falling. °General instructions °· Include calcium and vitamin D in your diet. Calcium is important for bone health, and vitamin D helps your body to absorb calcium. Good sources of calcium and vitamin D include: °? Certain fatty fish, such as salmon and tuna. °? Products that have calcium and vitamin D added to them (fortified products), such as fortified cereals. °? Egg yolks. °? Cheese. °? Liver. °· Take over-the-counter and prescription medicines only as told by your health care provider. °· Keep all follow-up visits as told by your health care provider. This is important. °Contact a health care provider if: °· You have never been screened for osteoporosis and you  are: °? A woman who is age 65 or older. °? A man who is age 70 or older. °Get help right away if: °· You fall or injure yourself. °Summary °· Osteoporosis is thinning and loss of density in your bones. This makes bones more brittle and fragile and more likely to break (fracture),even with minor falls. °· The goal of treatment is to strengthen your bones and reduce your risk for a fracture. °· Include calcium and vitamin D in your diet. Calcium is important for bone health, and vitamin D helps your body to absorb calcium. °· Talk with your health care provider about screening for osteoporosis if you are a woman who is age 65 or older, or a man who is age 70 or older. °This information is not intended to replace advice given to you by your health care provider. Make sure you discuss any questions you have with your health care provider. °Document Revised: 03/10/2017 Document Reviewed: 01/20/2017 °Elsevier Patient Education © 2020 [employer] ° °

## 2020-01-09 DIAGNOSIS — M9904 Segmental and somatic dysfunction of sacral region: Secondary | ICD-10-CM | POA: Diagnosis not present

## 2020-01-09 DIAGNOSIS — M9905 Segmental and somatic dysfunction of pelvic region: Secondary | ICD-10-CM | POA: Diagnosis not present

## 2020-01-09 DIAGNOSIS — M9906 Segmental and somatic dysfunction of lower extremity: Secondary | ICD-10-CM | POA: Diagnosis not present

## 2020-01-09 DIAGNOSIS — M9903 Segmental and somatic dysfunction of lumbar region: Secondary | ICD-10-CM | POA: Diagnosis not present

## 2020-02-06 DIAGNOSIS — M9906 Segmental and somatic dysfunction of lower extremity: Secondary | ICD-10-CM | POA: Diagnosis not present

## 2020-02-06 DIAGNOSIS — M9904 Segmental and somatic dysfunction of sacral region: Secondary | ICD-10-CM | POA: Diagnosis not present

## 2020-02-06 DIAGNOSIS — M9903 Segmental and somatic dysfunction of lumbar region: Secondary | ICD-10-CM | POA: Diagnosis not present

## 2020-02-06 DIAGNOSIS — M9905 Segmental and somatic dysfunction of pelvic region: Secondary | ICD-10-CM | POA: Diagnosis not present

## 2020-03-09 DIAGNOSIS — M9905 Segmental and somatic dysfunction of pelvic region: Secondary | ICD-10-CM | POA: Diagnosis not present

## 2020-03-09 DIAGNOSIS — M9904 Segmental and somatic dysfunction of sacral region: Secondary | ICD-10-CM | POA: Diagnosis not present

## 2020-03-09 DIAGNOSIS — M9906 Segmental and somatic dysfunction of lower extremity: Secondary | ICD-10-CM | POA: Diagnosis not present

## 2020-03-09 DIAGNOSIS — M9903 Segmental and somatic dysfunction of lumbar region: Secondary | ICD-10-CM | POA: Diagnosis not present

## 2020-04-13 DIAGNOSIS — M9904 Segmental and somatic dysfunction of sacral region: Secondary | ICD-10-CM | POA: Diagnosis not present

## 2020-04-13 DIAGNOSIS — M9905 Segmental and somatic dysfunction of pelvic region: Secondary | ICD-10-CM | POA: Diagnosis not present

## 2020-04-13 DIAGNOSIS — M9903 Segmental and somatic dysfunction of lumbar region: Secondary | ICD-10-CM | POA: Diagnosis not present

## 2020-04-13 DIAGNOSIS — M9906 Segmental and somatic dysfunction of lower extremity: Secondary | ICD-10-CM | POA: Diagnosis not present

## 2020-05-18 DIAGNOSIS — M9903 Segmental and somatic dysfunction of lumbar region: Secondary | ICD-10-CM | POA: Diagnosis not present

## 2020-05-18 DIAGNOSIS — M9905 Segmental and somatic dysfunction of pelvic region: Secondary | ICD-10-CM | POA: Diagnosis not present

## 2020-05-18 DIAGNOSIS — M9906 Segmental and somatic dysfunction of lower extremity: Secondary | ICD-10-CM | POA: Diagnosis not present

## 2020-05-18 DIAGNOSIS — M9904 Segmental and somatic dysfunction of sacral region: Secondary | ICD-10-CM | POA: Diagnosis not present

## 2020-06-15 DIAGNOSIS — M9903 Segmental and somatic dysfunction of lumbar region: Secondary | ICD-10-CM | POA: Diagnosis not present

## 2020-06-15 DIAGNOSIS — M9905 Segmental and somatic dysfunction of pelvic region: Secondary | ICD-10-CM | POA: Diagnosis not present

## 2020-06-15 DIAGNOSIS — M9906 Segmental and somatic dysfunction of lower extremity: Secondary | ICD-10-CM | POA: Diagnosis not present

## 2020-06-15 DIAGNOSIS — M9904 Segmental and somatic dysfunction of sacral region: Secondary | ICD-10-CM | POA: Diagnosis not present

## 2020-06-26 ENCOUNTER — Other Ambulatory Visit: Payer: Self-pay | Admitting: Internal Medicine

## 2020-06-26 DIAGNOSIS — Z1231 Encounter for screening mammogram for malignant neoplasm of breast: Secondary | ICD-10-CM

## 2020-07-20 DIAGNOSIS — M9906 Segmental and somatic dysfunction of lower extremity: Secondary | ICD-10-CM | POA: Diagnosis not present

## 2020-07-20 DIAGNOSIS — M9904 Segmental and somatic dysfunction of sacral region: Secondary | ICD-10-CM | POA: Diagnosis not present

## 2020-07-20 DIAGNOSIS — M9903 Segmental and somatic dysfunction of lumbar region: Secondary | ICD-10-CM | POA: Diagnosis not present

## 2020-07-20 DIAGNOSIS — M9905 Segmental and somatic dysfunction of pelvic region: Secondary | ICD-10-CM | POA: Diagnosis not present

## 2020-08-17 DIAGNOSIS — M9905 Segmental and somatic dysfunction of pelvic region: Secondary | ICD-10-CM | POA: Diagnosis not present

## 2020-08-17 DIAGNOSIS — M9903 Segmental and somatic dysfunction of lumbar region: Secondary | ICD-10-CM | POA: Diagnosis not present

## 2020-08-17 DIAGNOSIS — M9904 Segmental and somatic dysfunction of sacral region: Secondary | ICD-10-CM | POA: Diagnosis not present

## 2020-08-17 DIAGNOSIS — M9906 Segmental and somatic dysfunction of lower extremity: Secondary | ICD-10-CM | POA: Diagnosis not present

## 2020-08-21 ENCOUNTER — Ambulatory Visit: Payer: BC Managed Care – PPO

## 2020-08-29 ENCOUNTER — Ambulatory Visit
Admission: RE | Admit: 2020-08-29 | Discharge: 2020-08-29 | Disposition: A | Discharge status: Home / Self Care | Payer: BC Managed Care – PPO | Source: Ambulatory Visit | Attending: Internal Medicine | Admitting: Internal Medicine

## 2020-08-29 ENCOUNTER — Other Ambulatory Visit: Payer: Self-pay

## 2020-08-29 DIAGNOSIS — Z1231 Encounter for screening mammogram for malignant neoplasm of breast: Secondary | ICD-10-CM | POA: Diagnosis not present

## 2020-09-14 DIAGNOSIS — M9905 Segmental and somatic dysfunction of pelvic region: Secondary | ICD-10-CM | POA: Diagnosis not present

## 2020-09-14 DIAGNOSIS — M9904 Segmental and somatic dysfunction of sacral region: Secondary | ICD-10-CM | POA: Diagnosis not present

## 2020-09-14 DIAGNOSIS — M9903 Segmental and somatic dysfunction of lumbar region: Secondary | ICD-10-CM | POA: Diagnosis not present

## 2020-09-14 DIAGNOSIS — M9906 Segmental and somatic dysfunction of lower extremity: Secondary | ICD-10-CM | POA: Diagnosis not present

## 2020-10-19 DIAGNOSIS — M9904 Segmental and somatic dysfunction of sacral region: Secondary | ICD-10-CM | POA: Diagnosis not present

## 2020-10-19 DIAGNOSIS — M9905 Segmental and somatic dysfunction of pelvic region: Secondary | ICD-10-CM | POA: Diagnosis not present

## 2020-10-19 DIAGNOSIS — M9903 Segmental and somatic dysfunction of lumbar region: Secondary | ICD-10-CM | POA: Diagnosis not present

## 2020-10-19 DIAGNOSIS — M9906 Segmental and somatic dysfunction of lower extremity: Secondary | ICD-10-CM | POA: Diagnosis not present

## 2020-11-16 DIAGNOSIS — M9903 Segmental and somatic dysfunction of lumbar region: Secondary | ICD-10-CM | POA: Diagnosis not present

## 2020-11-16 DIAGNOSIS — M9905 Segmental and somatic dysfunction of pelvic region: Secondary | ICD-10-CM | POA: Diagnosis not present

## 2020-11-16 DIAGNOSIS — M9904 Segmental and somatic dysfunction of sacral region: Secondary | ICD-10-CM | POA: Diagnosis not present

## 2020-11-16 DIAGNOSIS — M9906 Segmental and somatic dysfunction of lower extremity: Secondary | ICD-10-CM | POA: Diagnosis not present

## 2020-12-10 NOTE — Progress Notes (Signed)
Subjective:    Patient ID: Jenny Haley, female    DOB: [birth year], 60 y.o.   MRN: [id number]   This visit occurred during the SARS-CoV-2 public health emergency.  Safety protocols were in place, including screening questions prior to the visit, additional usage of staff PPE, and extensive cleaning of exam room while observing appropriate contact time as indicated for disinfecting solutions.    HPI She is here for a physical exam.   Sweating during the day and at night  x 1 month - 6 weeks.  This mostly involves her head, neck and sometimes her torso.  He does not take any exertion for this to happen.   Bruising in b/l LE x 6 months.  A couple of new ones posterior thighs and right forearm.  No pain.  No significant trauma.  The ones on her lower legs she was concerned about because they are not going away.  Tick off in may- still has bite mark.    Medications and allergies reviewed with patient and updated if appropriate.  Patient Active Problem List   Diagnosis Date Noted   Lumbar strain, initial encounter 08/13/2019   Hyperglycemia 09/21/2016   Osteoporosis 09/26/2015   Hyperlipidemia 08/18/2015   Allergic rhinitis 06/09/2011   MVP (mitral valve prolapse) 08/25/2009   Angioedema 08/25/2009    Current Outpatient Medications on File Prior to Visit  Medication Sig Dispense Refill   Fexofenadine HCl (ALLEGRA ALLERGY PO) Take by mouth as needed.     Red Yeast Rice Extract (RED YEAST RICE PO) Take by mouth.     UNABLE TO FIND Take 480 mg by mouth daily. [facility].     No current facility-administered medications on file prior to visit.    Past Medical History:  Diagnosis Date   Allergy    Angioedema    eval by rheum and allergy dr Irena Cords now   Chicken pox    H/O angioedema    neg rheum pos rf dr Dareen Piano 2011   Infertility    Mitral valve prolapse    echo years ago fainting in childhood    Osteopenia     Past Surgical  History:  Procedure Laterality Date   LAPAROSCOPIC ABDOMINAL EXPLORATION  1992   Found minimal endometriosis   WISDOM TOOTH EXTRACTION      Social History   Socioeconomic History   Marital status: Married    Spouse name: Not on file   Number of children: Not on file   Years of education: Not on file   Highest education level: Not on file  Occupational History   Not on file  Tobacco Use   Smoking status: Never   Smokeless tobacco: Never  Vaping Use   Vaping Use: Never used  Substance and Sexual Activity   Alcohol use: No   Drug use: No   Sexual activity: Yes    Birth control/protection: Post-menopausal, Surgical  Other Topics Concern   Not on file  Social History Narrative   Sleeps about 6-7 hours of sleep per night (thanks to menopause)   2 people living in the home   Has a small dog in the home    p0 g0   No tobacco ets    Husband IT  [employer]  Caring for parents    Neg tad      Exercises - walks 4 miles, bikes 10 miles   Social Determinants of Health  Financial Resource Strain: Not on file  Food Insecurity: Not on file  Transportation Needs: Not on file  Physical Activity: Not on file  Stress: Not on file  Social Connections: Not on file    Family History  Problem Relation Age of Onset   Hyperlipidemia Mother    Hypertension Mother    Arthritis Mother        hands    Endometrial cancer Mother 55       surgery, radiation   Vaginal cancer Mother    Esophageal cancer Father        Esophageal with metastisis to the liver   Polycystic kidney disease Brother        congental cr 5 range, s/p transplant   Hypertension Brother    Colon cancer Neg Hx    Rectal cancer Neg Hx    Stomach cancer Neg Hx     Review of Systems  Constitutional:  Positive for diaphoresis (usually around neck, sometimes torso). Negative for chills and fever.  Eyes:  Negative for visual disturbance.  Respiratory:  Negative for cough, shortness of breath  and wheezing.   Cardiovascular:  Negative for chest pain, palpitations and leg swelling.  Gastrointestinal:  Negative for abdominal pain, blood in stool, constipation, diarrhea and nausea.       No gerd  Genitourinary:  Negative for dysuria, frequency and hematuria.  Musculoskeletal:  Negative for arthralgias and back pain.  Skin:  Negative for color change and rash.  Neurological:  Negative for dizziness, light-headedness, numbness and headaches.  Hematological:  Bruises/bleeds easily (bruising that wont go away).  Psychiatric/Behavioral:  Negative for dysphoric mood. The patient is not nervous/anxious.       Objective:   Vitals:   12/11/20 1002  BP: 120/80  Pulse: 84  Temp: 98.1 F (36.7 C)  SpO2: 99%   Filed Weights   12/11/20 1002  Weight: 134 lb (60.8 kg)   Body mass index is 22.3 kg/m.  BP Readings from Last 3 Encounters:  12/11/20 120/80  12/12/19 122/68  11/13/19 120/78    Wt Readings from Last 3 Encounters:  12/11/20 134 lb (60.8 kg)  12/12/19 132 lb 3.2 oz (60 kg)  11/13/19 132 lb (59.9 kg)    Depression screen [facility] 2/9 08/13/2019 11/29/2017  Decreased Interest 0 0  Down, Depressed, Hopeless 0 0  PHQ - 2 Score 0 0  Altered sleeping - 0  Tired, decreased energy - 0  Change in appetite - 0  Feeling bad or failure about yourself  - 0  Trouble concentrating - 0  Moving slowly or fidgety/restless - 0  Suicidal thoughts - 0  PHQ-9 Score - 0    No flowsheet data found.     Physical Exam Constitutional: She appears well-developed and well-nourished. No distress.  HENT:  Head: Normocephalic and atraumatic.  Right Ear: External ear normal. Normal ear canal and TM Left Ear: External ear normal.  Normal ear canal and TM Mouth/Throat: Oropharynx is clear and moist.  Eyes: Conjunctivae and EOM are normal.  Neck: Neck supple. No tracheal deviation present. No thyromegaly present.  No carotid bruit  Cardiovascular: Normal rate, regular rhythm and normal  heart sounds.   No murmur heard.  No edema. Pulmonary/Chest: Effort normal and breath sounds normal. No respiratory distress. She has no wheezes. She has no rales.  Breast: deferred   Abdominal: Soft. She exhibits no distension. There is no tenderness.  Lymphadenopathy: She has no cervical adenopathy.  Skin: Skin is warm  and dry. She is not diaphoretic.  1 pea-sized bruise right anterior forearm.  Couple of areas on her lower extremities that are difficult to tell if they are bruising or veins Psychiatric: She has a normal mood and affect. Her behavior is normal.     Lab Results  Component Value Date   WBC 10.9 (H) 08/13/2019   HGB 14.6 08/13/2019   HCT 42.7 08/13/2019   PLT 310.0 08/13/2019   GLUCOSE 115 (H) 08/13/2019   CHOL 223 (H) 08/13/2019   TRIG 117.0 08/13/2019   HDL 65.50 08/13/2019   LDLDIRECT 162.2 03/13/2013   LDLCALC 134 (H) 08/13/2019   ALT 21 08/13/2019   AST 22 08/13/2019   NA 137 08/13/2019   K 3.6 08/13/2019   CL 101 08/13/2019   CREATININE 0.79 08/13/2019   BUN 22 08/13/2019   CO2 29 08/13/2019   TSH 2.30 08/13/2019   HGBA1C 5.3 08/13/2019         Assessment & Plan:   Physical exam: Screening blood work  ordered Exercise  walking 2-4 miles a day and yoga Weight  normal Substance abuse  none     Reviewed recommended immunizations.   Health Maintenance  Topic Date Due   Zoster Vaccines- Shingrix (1 of 2) Never done   COVID-19 Vaccine (3 - Booster for Pfizer series) 02/23/2020   INFLUENZA VACCINE  07/09/2021 (Originally 11/09/2020)   DEXA SCAN  10/28/2021   MAMMOGRAM  08/30/2022   PAP SMEAR-Modifier  12/05/2022   COLONOSCOPY (Pts 45-33yrs Insurance coverage will need to be confirmed)  05/03/2023   TETANUS/TDAP  08/17/2025   Hepatitis C Screening  Completed   HIV Screening  Completed   Pneumococcal Vaccine 10-36 Years old  Aged Out   HPV VACCINES  Aged Out          See Problem List for Assessment and Plan of chronic medical  problems.

## 2020-12-10 NOTE — Patient Instructions (Addendum)
Blood work was ordered.     Medications changes include :   none    Please followup in 1 year   Health Maintenance, Female Adopting a healthy lifestyle and getting preventive care are important in promoting health and wellness. Ask your health care provider about: The right schedule for you to have regular tests and exams. Things you can do on your own to prevent diseases and keep yourself healthy. What should I know about diet, weight, and exercise? Eat a healthy diet  Eat a diet that includes plenty of vegetables, fruits, low-fat dairy products, and lean protein. Do not eat a lot of foods that are high in solid fats, added sugars, or sodium. Maintain a healthy weight Body mass index (BMI) is used to identify weight problems. It estimates body fat based on height and weight. Your health care provider can help determine your BMI and help you achieve or maintain a healthy weight. Get regular exercise Get regular exercise. This is one of the most important things you can do for your health. Most adults should: Exercise for at least 150 minutes each week. The exercise should increase your heart rate and make you sweat (moderate-intensity exercise). Do strengthening exercises at least twice a week. This is in addition to the moderate-intensity exercise. Spend less time sitting. Even light physical activity can be beneficial. Watch cholesterol and blood lipids Have your blood tested for lipids and cholesterol at 60 years of age, then have this test every 5 years. Have your cholesterol levels checked more often if: Your lipid or cholesterol levels are high. You are older than 60 years of age. You are at high risk for heart disease. What should I know about cancer screening? Depending on your health history and family history, you may need to have cancer screening at various ages. This may include screening for: Breast cancer. Cervical cancer. Colorectal cancer. Skin cancer. Lung  cancer. What should I know about heart disease, diabetes, and high blood pressure? Blood pressure and heart disease High blood pressure causes heart disease and increases the risk of stroke. This is more likely to develop in people who have high blood pressure readings, are of African descent, or are overweight. Have your blood pressure checked: Every 3-5 years if you are 18-39 years of age. Every year if you are 40 years old or older. Diabetes Have regular diabetes screenings. This checks your fasting blood sugar level. Have the screening done: Once every three years after age 40 if you are at a normal weight and have a low risk for diabetes. More often and at a younger age if you are overweight or have a high risk for diabetes. What should I know about preventing infection? Hepatitis B If you have a higher risk for hepatitis B, you should be screened for this virus. Talk with your health care provider to find out if you are at risk for hepatitis B infection. Hepatitis C Testing is recommended for: Everyone born from 1945 through 1965. Anyone with known risk factors for hepatitis C. Sexually transmitted infections (STIs) Get screened for STIs, including gonorrhea and chlamydia, if: You are sexually active and are younger than 60 years of age. You are older than 60 years of age and your health care provider tells you that you are at risk for this type of infection. Your sexual activity has changed since you were last screened, and you are at increased risk for chlamydia or gonorrhea. Ask your health care provider if you are   at risk. Ask your health care provider about whether you are at high risk for HIV. Your health care provider may recommend a prescription medicine to help prevent HIV infection. If you choose to take medicine to prevent HIV, you should first get tested for HIV. You should then be tested every 3 months for as long as you are taking the medicine. Pregnancy If you are about  to stop having your period (premenopausal) and you may become pregnant, seek counseling before you get pregnant. Take 400 to 800 micrograms (mcg) of folic acid every day if you become pregnant. Ask for birth control (contraception) if you want to prevent pregnancy. Osteoporosis and menopause Osteoporosis is a disease in which the bones lose minerals and strength with aging. This can result in bone fractures. If you are 65 years old or older, or if you are at risk for osteoporosis and fractures, ask your health care provider if you should: Be screened for bone loss. Take a calcium or vitamin D supplement to lower your risk of fractures. Be given hormone replacement therapy (HRT) to treat symptoms of menopause. Follow these instructions at home: Lifestyle Do not use any products that contain nicotine or tobacco, such as cigarettes, e-cigarettes, and chewing tobacco. If you need help quitting, ask your health care provider. Do not use street drugs. Do not share needles. Ask your health care provider for help if you need support or information about quitting drugs. Alcohol use Do not drink alcohol if: Your health care provider tells you not to drink. You are pregnant, may be pregnant, or are planning to become pregnant. If you drink alcohol: Limit how much you use to 0-1 drink a day. Limit intake if you are breastfeeding. Be aware of how much alcohol is in your drink. In the [place] one drink equals one 12 oz bottle of beer (355 mL), one 5 oz glass of wine (148 mL), or one 1 oz glass of hard liquor (44 mL). General instructions Schedule regular health, dental, and eye exams. Stay current with your vaccines. Tell your health care provider if: You often feel depressed. You have ever been abused or do not feel safe at home. Summary Adopting a healthy lifestyle and getting preventive care are important in promoting health and wellness. Follow your health care provider's instructions about  healthy diet, exercising, and getting tested or screened for diseases. Follow your health care provider's instructions on monitoring your cholesterol and blood pressure. This information is not intended to replace advice given to you by your health care provider. Make sure you discuss any questions you have with your health care provider. Document Revised: 06/05/2020 Document Reviewed: 03/21/2018 Elsevier Patient Education  2022 [employer] Inc[employer]  

## 2020-12-11 ENCOUNTER — Other Ambulatory Visit: Payer: Self-pay

## 2020-12-11 ENCOUNTER — Ambulatory Visit (INDEPENDENT_AMBULATORY_CARE_PROVIDER_SITE_OTHER): Payer: BC Managed Care – PPO | Admitting: Internal Medicine

## 2020-12-11 ENCOUNTER — Encounter: Payer: Self-pay | Admitting: Internal Medicine

## 2020-12-11 ENCOUNTER — Encounter: Payer: BC Managed Care – PPO | Admitting: Internal Medicine

## 2020-12-11 VITALS — BP 120/80 | HR 84 | Temp 98.1°F | Ht 65.0 in | Wt 134.0 lb

## 2020-12-11 DIAGNOSIS — R739 Hyperglycemia, unspecified: Secondary | ICD-10-CM | POA: Diagnosis not present

## 2020-12-11 DIAGNOSIS — R61 Generalized hyperhidrosis: Secondary | ICD-10-CM | POA: Insufficient documentation

## 2020-12-11 DIAGNOSIS — E782 Mixed hyperlipidemia: Secondary | ICD-10-CM | POA: Diagnosis not present

## 2020-12-11 DIAGNOSIS — Z0000 Encounter for general adult medical examination without abnormal findings: Secondary | ICD-10-CM

## 2020-12-11 DIAGNOSIS — M810 Age-related osteoporosis without current pathological fracture: Secondary | ICD-10-CM | POA: Diagnosis not present

## 2020-12-11 LAB — COMPREHENSIVE METABOLIC PANEL
ALT: 15 U/L (ref 0–35)
AST: 21 U/L (ref 0–37)
Albumin: 4.6 g/dL (ref 3.5–5.2)
Alkaline Phosphatase: 52 U/L (ref 39–117)
BUN: 17 mg/dL (ref 6–23)
CO2: 27 mEq/L (ref 19–32)
Calcium: 10 mg/dL (ref 8.4–10.5)
Chloride: 102 mEq/L (ref 96–112)
Creatinine, Ser: 0.77 mg/dL (ref 0.40–1.20)
GFR: 84.2 mL/min (ref >60.00)
Glucose, Bld: 87 mg/dL (ref 70–99)
Potassium: 4.4 mEq/L (ref 3.5–5.1)
Sodium: 138 mEq/L (ref 135–145)
Total Bilirubin: 0.8 mg/dL (ref 0.2–1.2)
Total Protein: 7.6 g/dL (ref 6.0–8.3)

## 2020-12-11 LAB — LIPID PANEL
Cholesterol: 208 mg/dL — ABNORMAL HIGH (ref 0–200)
HDL: 70.5 mg/dL (ref >39.00)
LDL Cholesterol: 122 mg/dL — ABNORMAL HIGH (ref 0–99)
NonHDL: 137.87
Total CHOL/HDL Ratio: 3
Triglycerides: 81 mg/dL (ref 0.0–149.0)
VLDL: 16.2 mg/dL (ref 0.0–40.0)

## 2020-12-11 LAB — CBC WITH DIFFERENTIAL/PLATELET
Basophils Absolute: 0 10*3/uL (ref 0.0–0.1)
Basophils Relative: 0.3 % (ref 0.0–3.0)
Eosinophils Absolute: 0.1 10*3/uL (ref 0.0–0.7)
Eosinophils Relative: 1.4 % (ref 0.0–5.0)
HCT: 43.5 % (ref 36.0–46.0)
Hemoglobin: 14.6 g/dL (ref 12.0–15.0)
Lymphocytes Relative: 28.5 % (ref 12.0–46.0)
Lymphs Abs: 1.5 10*3/uL (ref 0.7–4.0)
MCHC: 33.6 g/dL (ref 30.0–36.0)
MCV: 92.8 fl (ref 78.0–100.0)
Monocytes Absolute: 0.5 10*3/uL (ref 0.1–1.0)
Monocytes Relative: 9.3 % (ref 3.0–12.0)
Neutro Abs: 3.2 10*3/uL (ref 1.4–7.7)
Neutrophils Relative %: 60.5 % (ref 43.0–77.0)
Platelets: 311 10*3/uL (ref 150.0–400.0)
RBC: 4.69 Mil/uL (ref 3.87–5.11)
RDW: 12.9 % (ref 11.5–15.5)
WBC: 5.3 10*3/uL (ref 4.0–10.5)

## 2020-12-11 LAB — VITAMIN D 25 HYDROXY (VIT D DEFICIENCY, FRACTURES): VITD: 43.38 ng/mL (ref 30.00–100.00)

## 2020-12-11 LAB — T4, FREE: Free T4: 0.97 ng/dL (ref 0.60–1.60)

## 2020-12-11 LAB — TSH: TSH: 2.14 u[IU]/mL (ref 0.35–5.50)

## 2020-12-11 LAB — HEMOGLOBIN A1C: Hgb A1c MFr Bld: 5.5 % (ref 4.6–6.5)

## 2020-12-11 MED ORDER — EPINEPHRINE 0.3 MG/0.3ML IJ SOAJ: 0.3000 mg | INTRAMUSCULAR | 3 refills | Status: DC | PRN

## 2020-12-11 NOTE — Assessment & Plan Note (Addendum)
Acute 4-6 weeks started - day and night No other concerning symptoms Occurs day and night - occurs at rest ? Cause Tsh, ft4, cbc, cmp Has gyn appt later this month

## 2020-12-11 NOTE — Assessment & Plan Note (Signed)
Chronic Check a1c Low sugar / carb diet Continue regular exercise 

## 2020-12-11 NOTE — Assessment & Plan Note (Addendum)
Chronic Check lipid panel, cmp, tsh  Diet controlled and red yeast rice.  She may try ginger to see if that helps as well-discussed that we can recheck her lipids at some point during the year if she would like to to see if it is effective Regular exercise and healthy diet encouraged

## 2020-12-11 NOTE — Assessment & Plan Note (Addendum)
Chronic dexa up to date On fosamax - started 12/2019-she took for 7 months and stopped secondary to abdominal pain Will order DEXA to see if we can get it done since there has been a change in treatment-was on Fosamax for 7 months and is now taking bone up consistently Taking calcium and vitamin daily Check vitamin d level Continue regular exercising - walking, yoga Discussed alternatives from Fosamax-last option for her may be Reclast

## 2020-12-17 ENCOUNTER — Ambulatory Visit: Payer: BC Managed Care – PPO | Admitting: Obstetrics and Gynecology

## 2020-12-18 DIAGNOSIS — D485 Neoplasm of uncertain behavior of skin: Secondary | ICD-10-CM | POA: Diagnosis not present

## 2020-12-18 DIAGNOSIS — L820 Inflamed seborrheic keratosis: Secondary | ICD-10-CM | POA: Diagnosis not present

## 2020-12-28 DIAGNOSIS — M9905 Segmental and somatic dysfunction of pelvic region: Secondary | ICD-10-CM | POA: Diagnosis not present

## 2020-12-28 DIAGNOSIS — M9904 Segmental and somatic dysfunction of sacral region: Secondary | ICD-10-CM | POA: Diagnosis not present

## 2020-12-28 DIAGNOSIS — M9903 Segmental and somatic dysfunction of lumbar region: Secondary | ICD-10-CM | POA: Diagnosis not present

## 2020-12-28 DIAGNOSIS — M9906 Segmental and somatic dysfunction of lower extremity: Secondary | ICD-10-CM | POA: Diagnosis not present

## 2020-12-28 NOTE — Progress Notes (Signed)
60 y.o. G0P0 Married White or Caucasian Not Hispanic or Latino female here for annual exam.  Patient states that she has been sweating more. Occurs day and night, mostly on her abdomen and neck. Recent normal TSH. Normal WBC.  Didn't have this issue when she went through menopause around age 32.      H/O vaginal atrophy and a stenotic, friable cervix. No vaginal bleeding.  Sexually active, minimal penetration secondary to ED. No dyspareunia.   No LMP recorded. Patient is postmenopausal.          Sexually active: Yes.    The current method of family planning is status post hysterectomy.    Exercising: Yes.     Walking and Yoga  Smoker:  no  Health Maintenance: Pap:  12/04/2017 WNL NEG HPV  History of abnormal Pap:  no  MMG:  08/29/20 density C Bi-rads 1 neg  BMD:   10/30/19 Osteoporotic T score -2.7 spine. She took fosamax for 7 months secondary to stomach pain. Getting repeat dexa, may do reclast.  Colonoscopy: 05/02/13 f/u 10 years  TDaP:  05/02/13 f/u 10 yeas  Gardasil: n/a   reports that she has never smoked. She has never used smokeless tobacco. She reports that she does not drink alcohol and does not use drugs. 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 Grown son, local.   Past Medical History:  Diagnosis Date   Allergy    Angioedema    eval by rheum and allergy dr Irena Cords now   Chicken pox    H/O angioedema    neg rheum pos rf dr Dareen Piano 2011   Infertility    Mitral valve prolapse    echo years ago fainting in childhood    Osteopenia     Past Surgical History:  Procedure Laterality Date   LAPAROSCOPIC ABDOMINAL EXPLORATION  1992   Found minimal endometriosis   WISDOM TOOTH EXTRACTION      Current Outpatient Medications  Medication Sig Dispense Refill   EPINEPHrine 0.3 mg/0.3 mL IJ SOAJ injection Inject 0.3 mg into the muscle as needed. 1 each 3   Fexofenadine HCl (ALLEGRA ALLERGY PO) Take by mouth as needed.     Red Yeast Rice Extract (RED YEAST  RICE PO) Take by mouth.     UNABLE TO FIND Take 480 mg by mouth daily. [facility].     No current facility-administered medications for this visit.    Family History  Problem Relation Age of Onset   Hyperlipidemia Mother    Hypertension Mother    Arthritis Mother        hands    Endometrial cancer Mother 15       surgery, radiation   Vaginal cancer Mother    Esophageal cancer Father        Esophageal with metastisis to the liver   Polycystic kidney disease Brother        congental cr 5 range, s/p transplant   Hypertension Brother    Colon cancer Neg Hx    Rectal cancer Neg Hx    Stomach cancer Neg Hx     Review of Systems  Exam:   BP 126/74   Pulse 77   Ht 5' 5.5" (1.664 m)   Wt 135 lb (61.2 kg)   SpO2 100%   BMI 22.12 kg/m   Weight change: @WEIGHTCHANGE @ Height:   Height: 5' 5.5" (166.4 cm)  Ht Readings from Last 3 Encounters:  12/29/20 5'  5.5" (1.664 m)  12/11/20 5\' 5"  (1.651 m)  12/12/19 5\' 5"  (1.651 m)    General appearance: alert, cooperative and appears stated age Head: Normocephalic, without obvious abnormality, atraumatic Neck: no adenopathy, supple, symmetrical, trachea midline and thyroid normal to inspection and palpation Lungs: clear to auscultation bilaterally Cardiovascular: regular rate and rhythm Breasts: normal appearance, no masses or tenderness Abdomen: soft, non-tender; non distended,  no masses,  no organomegaly Extremities: extremities normal, atraumatic, no cyanosis or edema Skin: Skin color, texture, turgor normal. No rashes or lesions Lymph nodes: Cervical, supraclavicular, and axillary nodes normal. No abnormal inguinal nodes palpated Neurologic: Grossly normal   Pelvic: External genitalia:  no lesions              Urethra:  normal appearing urethra with no masses, tenderness or lesions              Bartholins and Skenes: normal                 Vagina: atrophic appearing vagina with normal color and discharge, no  lesions              Cervix:  flush with vagina, high in vagina on the patient's left               Bimanual Exam:  Uterus:  normal size, contour, position, consistency, mobility, non-tender              Adnexa: no mass, fullness, tenderness               Rectovaginal: Confirms               Anus:  normal sphincter tone, no lesions  02/11/20 chaperoned for the exam.   1. Well woman exam Discussed breast self exam Mammogram and colonoscopy UTD Labs with primary No pap this year  2. History of osteoporosis Discussed calcium and vit D intake Getting a DEXA with her primary

## 2020-12-29 ENCOUNTER — Encounter: Payer: Self-pay | Admitting: Obstetrics and Gynecology

## 2020-12-29 ENCOUNTER — Ambulatory Visit (INDEPENDENT_AMBULATORY_CARE_PROVIDER_SITE_OTHER): Payer: BC Managed Care – PPO | Admitting: Obstetrics and Gynecology

## 2020-12-29 ENCOUNTER — Other Ambulatory Visit: Payer: Self-pay

## 2020-12-29 VITALS — BP 126/74 | HR 77 | Ht 65.5 in | Wt 135.0 lb

## 2020-12-29 DIAGNOSIS — Z01419 Encounter for gynecological examination (general) (routine) without abnormal findings: Secondary | ICD-10-CM | POA: Diagnosis not present

## 2020-12-29 DIAGNOSIS — Z8739 Personal history of other diseases of the musculoskeletal system and connective tissue: Secondary | ICD-10-CM | POA: Diagnosis not present

## 2020-12-29 NOTE — Patient Instructions (Signed)

## 2021-02-01 DIAGNOSIS — Z713 Dietary counseling and surveillance: Secondary | ICD-10-CM | POA: Diagnosis not present

## 2021-02-16 DIAGNOSIS — R051 Acute cough: Secondary | ICD-10-CM | POA: Diagnosis not present

## 2021-02-16 DIAGNOSIS — J029 Acute pharyngitis, unspecified: Secondary | ICD-10-CM | POA: Diagnosis not present

## 2021-02-16 DIAGNOSIS — Z20822 Contact with and (suspected) exposure to covid-19: Secondary | ICD-10-CM | POA: Diagnosis not present

## 2021-02-22 DIAGNOSIS — M9906 Segmental and somatic dysfunction of lower extremity: Secondary | ICD-10-CM | POA: Diagnosis not present

## 2021-02-22 DIAGNOSIS — M9904 Segmental and somatic dysfunction of sacral region: Secondary | ICD-10-CM | POA: Diagnosis not present

## 2021-02-22 DIAGNOSIS — M9903 Segmental and somatic dysfunction of lumbar region: Secondary | ICD-10-CM | POA: Diagnosis not present

## 2021-02-22 DIAGNOSIS — M9905 Segmental and somatic dysfunction of pelvic region: Secondary | ICD-10-CM | POA: Diagnosis not present

## 2021-03-22 DIAGNOSIS — M9903 Segmental and somatic dysfunction of lumbar region: Secondary | ICD-10-CM | POA: Diagnosis not present

## 2021-03-22 DIAGNOSIS — M9906 Segmental and somatic dysfunction of lower extremity: Secondary | ICD-10-CM | POA: Diagnosis not present

## 2021-03-22 DIAGNOSIS — M9905 Segmental and somatic dysfunction of pelvic region: Secondary | ICD-10-CM | POA: Diagnosis not present

## 2021-03-22 DIAGNOSIS — M9904 Segmental and somatic dysfunction of sacral region: Secondary | ICD-10-CM | POA: Diagnosis not present

## 2021-04-19 DIAGNOSIS — M9903 Segmental and somatic dysfunction of lumbar region: Secondary | ICD-10-CM | POA: Diagnosis not present

## 2021-04-19 DIAGNOSIS — M9904 Segmental and somatic dysfunction of sacral region: Secondary | ICD-10-CM | POA: Diagnosis not present

## 2021-04-19 DIAGNOSIS — M9906 Segmental and somatic dysfunction of lower extremity: Secondary | ICD-10-CM | POA: Diagnosis not present

## 2021-04-19 DIAGNOSIS — M9905 Segmental and somatic dysfunction of pelvic region: Secondary | ICD-10-CM | POA: Diagnosis not present

## 2021-05-17 DIAGNOSIS — M9904 Segmental and somatic dysfunction of sacral region: Secondary | ICD-10-CM | POA: Diagnosis not present

## 2021-05-17 DIAGNOSIS — M9905 Segmental and somatic dysfunction of pelvic region: Secondary | ICD-10-CM | POA: Diagnosis not present

## 2021-05-17 DIAGNOSIS — M9906 Segmental and somatic dysfunction of lower extremity: Secondary | ICD-10-CM | POA: Diagnosis not present

## 2021-05-17 DIAGNOSIS — M9903 Segmental and somatic dysfunction of lumbar region: Secondary | ICD-10-CM | POA: Diagnosis not present

## 2021-05-19 DIAGNOSIS — D225 Melanocytic nevi of trunk: Secondary | ICD-10-CM | POA: Diagnosis not present

## 2021-05-19 DIAGNOSIS — L821 Other seborrheic keratosis: Secondary | ICD-10-CM | POA: Diagnosis not present

## 2021-05-19 DIAGNOSIS — D2261 Melanocytic nevi of right upper limb, including shoulder: Secondary | ICD-10-CM | POA: Diagnosis not present

## 2021-05-19 DIAGNOSIS — L814 Other melanin hyperpigmentation: Secondary | ICD-10-CM | POA: Diagnosis not present

## 2021-06-21 DIAGNOSIS — M9903 Segmental and somatic dysfunction of lumbar region: Secondary | ICD-10-CM | POA: Diagnosis not present

## 2021-06-21 DIAGNOSIS — M9904 Segmental and somatic dysfunction of sacral region: Secondary | ICD-10-CM | POA: Diagnosis not present

## 2021-06-21 DIAGNOSIS — M9905 Segmental and somatic dysfunction of pelvic region: Secondary | ICD-10-CM | POA: Diagnosis not present

## 2021-06-21 DIAGNOSIS — M9906 Segmental and somatic dysfunction of lower extremity: Secondary | ICD-10-CM | POA: Diagnosis not present

## 2021-07-19 DIAGNOSIS — M9905 Segmental and somatic dysfunction of pelvic region: Secondary | ICD-10-CM | POA: Diagnosis not present

## 2021-07-19 DIAGNOSIS — M9906 Segmental and somatic dysfunction of lower extremity: Secondary | ICD-10-CM | POA: Diagnosis not present

## 2021-07-19 DIAGNOSIS — M9904 Segmental and somatic dysfunction of sacral region: Secondary | ICD-10-CM | POA: Diagnosis not present

## 2021-07-19 DIAGNOSIS — M9903 Segmental and somatic dysfunction of lumbar region: Secondary | ICD-10-CM | POA: Diagnosis not present

## 2021-08-02 ENCOUNTER — Ambulatory Visit
Admission: RE | Admit: 2021-08-02 | Discharge: 2021-08-02 | Disposition: A | Discharge status: Home / Self Care | Payer: BC Managed Care – PPO | Source: Ambulatory Visit | Attending: Internal Medicine | Admitting: Internal Medicine

## 2021-08-02 DIAGNOSIS — M810 Age-related osteoporosis without current pathological fracture: Secondary | ICD-10-CM

## 2021-08-02 DIAGNOSIS — M8589 Other specified disorders of bone density and structure, multiple sites: Secondary | ICD-10-CM | POA: Diagnosis not present

## 2021-08-02 DIAGNOSIS — Z780 Asymptomatic menopausal state: Secondary | ICD-10-CM | POA: Diagnosis not present

## 2021-08-16 DIAGNOSIS — M9904 Segmental and somatic dysfunction of sacral region: Secondary | ICD-10-CM | POA: Diagnosis not present

## 2021-08-16 DIAGNOSIS — M9903 Segmental and somatic dysfunction of lumbar region: Secondary | ICD-10-CM | POA: Diagnosis not present

## 2021-08-16 DIAGNOSIS — M9905 Segmental and somatic dysfunction of pelvic region: Secondary | ICD-10-CM | POA: Diagnosis not present

## 2021-08-16 DIAGNOSIS — M9906 Segmental and somatic dysfunction of lower extremity: Secondary | ICD-10-CM | POA: Diagnosis not present

## 2021-08-25 ENCOUNTER — Other Ambulatory Visit: Payer: Self-pay | Admitting: Internal Medicine

## 2021-08-25 DIAGNOSIS — Z1231 Encounter for screening mammogram for malignant neoplasm of breast: Secondary | ICD-10-CM

## 2021-09-27 ENCOUNTER — Ambulatory Visit: Payer: BC Managed Care – PPO

## 2021-10-11 DIAGNOSIS — M9903 Segmental and somatic dysfunction of lumbar region: Secondary | ICD-10-CM | POA: Diagnosis not present

## 2021-10-11 DIAGNOSIS — M9905 Segmental and somatic dysfunction of pelvic region: Secondary | ICD-10-CM | POA: Diagnosis not present

## 2021-10-11 DIAGNOSIS — M9906 Segmental and somatic dysfunction of lower extremity: Secondary | ICD-10-CM | POA: Diagnosis not present

## 2021-10-11 DIAGNOSIS — M9904 Segmental and somatic dysfunction of sacral region: Secondary | ICD-10-CM | POA: Diagnosis not present

## 2021-10-18 DIAGNOSIS — M9904 Segmental and somatic dysfunction of sacral region: Secondary | ICD-10-CM | POA: Diagnosis not present

## 2021-10-18 DIAGNOSIS — M9905 Segmental and somatic dysfunction of pelvic region: Secondary | ICD-10-CM | POA: Diagnosis not present

## 2021-10-18 DIAGNOSIS — M9906 Segmental and somatic dysfunction of lower extremity: Secondary | ICD-10-CM | POA: Diagnosis not present

## 2021-10-18 DIAGNOSIS — M9903 Segmental and somatic dysfunction of lumbar region: Secondary | ICD-10-CM | POA: Diagnosis not present

## 2021-11-09 ENCOUNTER — Ambulatory Visit
Admission: RE | Admit: 2021-11-09 | Discharge: 2021-11-09 | Disposition: A | Discharge status: Home / Self Care | Payer: BC Managed Care – PPO | Source: Ambulatory Visit | Attending: Internal Medicine | Admitting: Internal Medicine

## 2021-11-09 DIAGNOSIS — Z1231 Encounter for screening mammogram for malignant neoplasm of breast: Secondary | ICD-10-CM

## 2021-11-11 ENCOUNTER — Other Ambulatory Visit: Payer: Self-pay | Admitting: Internal Medicine

## 2021-11-11 DIAGNOSIS — R928 Other abnormal and inconclusive findings on diagnostic imaging of breast: Secondary | ICD-10-CM

## 2021-11-12 ENCOUNTER — Ambulatory Visit (INDEPENDENT_AMBULATORY_CARE_PROVIDER_SITE_OTHER): Payer: BC Managed Care – PPO | Admitting: *Deleted

## 2021-11-12 DIAGNOSIS — Z23 Encounter for immunization: Secondary | ICD-10-CM

## 2021-11-12 NOTE — Progress Notes (Signed)
Pls cosign for SHINGLE inj.Marland KitchenRaechel Chute

## 2021-11-18 ENCOUNTER — Ambulatory Visit: Payer: BC Managed Care – PPO

## 2021-11-18 ENCOUNTER — Ambulatory Visit
Admission: RE | Admit: 2021-11-18 | Discharge: 2021-11-18 | Disposition: A | Discharge status: Home / Self Care | Payer: BC Managed Care – PPO | Source: Ambulatory Visit | Attending: Internal Medicine | Admitting: Internal Medicine

## 2021-11-18 DIAGNOSIS — R928 Other abnormal and inconclusive findings on diagnostic imaging of breast: Secondary | ICD-10-CM

## 2021-11-18 DIAGNOSIS — N6489 Other specified disorders of breast: Secondary | ICD-10-CM | POA: Diagnosis not present

## 2021-11-22 DIAGNOSIS — M9904 Segmental and somatic dysfunction of sacral region: Secondary | ICD-10-CM | POA: Diagnosis not present

## 2021-11-22 DIAGNOSIS — M9905 Segmental and somatic dysfunction of pelvic region: Secondary | ICD-10-CM | POA: Diagnosis not present

## 2021-11-22 DIAGNOSIS — M9903 Segmental and somatic dysfunction of lumbar region: Secondary | ICD-10-CM | POA: Diagnosis not present

## 2021-11-22 DIAGNOSIS — M9906 Segmental and somatic dysfunction of lower extremity: Secondary | ICD-10-CM | POA: Diagnosis not present

## 2021-12-13 ENCOUNTER — Encounter: Payer: Self-pay | Admitting: Internal Medicine

## 2021-12-13 NOTE — Progress Notes (Signed)
Subjective:    Patient ID: Jenny Haley, female    DOB: [birth year], 61 y.o.   MRN: [id number]      HPI Jenny Haley is here for a Physical exam.    Mild covid last October - it was like a bad cold  - has been tired since then.  Her fatigue is finally starting to get better.  She denies any other residual symptoms from COVID.  She did not exercise for a while after COVID, but has restarted recently and is doing exercise regularly.  Medications and allergies reviewed with patient and updated if appropriate.  Current Outpatient Medications on File Prior to Visit  Medication Sig Dispense Refill   EPINEPHrine 0.3 mg/0.3 mL IJ SOAJ injection Inject 0.3 mg into the muscle as needed. 1 each 3   Fexofenadine HCl (ALLEGRA ALLERGY PO) Take by mouth as needed.     Red Yeast Rice Extract (RED YEAST RICE PO) Take by mouth.     UNABLE TO FIND Take 480 mg by mouth daily. [facility].     No current facility-administered medications on file prior to visit.    Review of Systems  Constitutional:  Positive for fatigue (since covid). Negative for fever.  Eyes:  Negative for visual disturbance.  Respiratory:  Negative for cough, shortness of breath and wheezing.   Cardiovascular:  Negative for chest pain, palpitations and leg swelling.  Gastrointestinal:  Negative for abdominal pain, blood in stool, constipation, diarrhea and nausea.       No gerd  Genitourinary:  Negative for dysuria.  Musculoskeletal:  Positive for arthralgias (hands - OA). Negative for back pain.  Skin:  Negative for rash.  Neurological:  Negative for dizziness, light-headedness and headaches.  Psychiatric/Behavioral:  Negative for dysphoric mood and sleep disturbance. The patient is not nervous/anxious.        Objective:   Vitals:   12/14/21 1009  BP: 120/76  Pulse: 76  Temp: 98 F (36.7 C)  SpO2: 96%   Filed Weights   12/14/21 1009  Weight: 132 lb (59.9 kg)   Body mass index is  21.63 kg/m.  BP Readings from Last 3 Encounters:  12/14/21 120/76  12/29/20 126/74  12/11/20 120/80    Wt Readings from Last 3 Encounters:  12/14/21 132 lb (59.9 kg)  12/29/20 135 lb (61.2 kg)  12/11/20 134 lb (60.8 kg)       Physical Exam Constitutional: She appears well-developed and well-nourished. No distress.  HENT:  Head: Normocephalic and atraumatic.  Right Ear: External ear normal. Normal ear canal and TM Left Ear: External ear normal.  Normal ear canal and TM Mouth/Throat: Oropharynx is clear and moist.  Eyes: Conjunctivae normal.  Neck: Neck supple. No tracheal deviation present. No thyromegaly present.  No carotid bruit  Cardiovascular: Normal rate, regular rhythm and normal heart sounds.   No murmur heard.  No edema. Pulmonary/Chest: Effort normal and breath sounds normal. No respiratory distress. She has no wheezes. She has no rales.  Breast: deferred   Abdominal: Soft. She exhibits no distension. There is no tenderness.  Lymphadenopathy: She has no cervical adenopathy.  Skin: Skin is warm and dry. She is not diaphoretic.  Psychiatric: She has a normal mood and affect. Her behavior is normal.     Lab Results  Component Value Date   WBC 5.3 12/11/2020   HGB 14.6 12/11/2020   HCT 43.5 12/11/2020   PLT 311.0 12/11/2020   GLUCOSE 87 12/11/2020  CHOL 208 (H) 12/11/2020   TRIG 81.0 12/11/2020   HDL 70.50 12/11/2020   LDLDIRECT 162.2 03/13/2013   LDLCALC 122 (H) 12/11/2020   ALT 15 12/11/2020   AST 21 12/11/2020   NA 138 12/11/2020   K 4.4 12/11/2020   CL 102 12/11/2020   CREATININE 0.77 12/11/2020   BUN 17 12/11/2020   CO2 27 12/11/2020   TSH 2.14 12/11/2020   HGBA1C 5.5 12/11/2020         Assessment & Plan:   Physical exam: Screening blood work  ordered Exercise  regular Weight  normal Substance abuse  none   Reviewed recommended immunizations.   Health Maintenance  Topic Date Due   COVID-19 Vaccine (3 - Pfizer series) 12/30/2021  (Originally 11/18/2019)   INFLUENZA VACCINE  07/10/2022 (Originally 11/09/2021)   Zoster Vaccines- Shingrix (2 of 2) 01/07/2022   PAP SMEAR-Modifier  12/05/2022   COLONOSCOPY (Pts 45-66yrs Insurance coverage will need to be confirmed)  05/03/2023   DEXA SCAN  08/03/2023   MAMMOGRAM  11/10/2023   TETANUS/TDAP  08/17/2025   Hepatitis C Screening  Completed   HIV Screening  Completed   HPV VACCINES  Aged Out          See Problem List for Assessment and Plan of chronic medical problems.

## 2021-12-13 NOTE — Patient Instructions (Addendum)
Blood work was ordered.     Medications changes include :   none   Your prescription(s) have been sent to your pharmacy.    Ct of your heart arteries ordered.    Return in about 1 year (around 12/15/2022) for Physical Exam.    Health Maintenance, Female Adopting a healthy lifestyle and getting preventive care are important in promoting health and wellness. Ask your health care provider about: The right schedule for you to have regular tests and exams. Things you can do on your own to prevent diseases and keep yourself healthy. What should I know about diet, weight, and exercise? Eat a healthy diet  Eat a diet that includes plenty of vegetables, fruits, low-fat dairy products, and lean protein. Do not eat a lot of foods that are high in solid fats, added sugars, or sodium. Maintain a healthy weight Body mass index (BMI) is used to identify weight problems. It estimates body fat based on height and weight. Your health care provider can help determine your BMI and help you achieve or maintain a healthy weight. Get regular exercise Get regular exercise. This is one of the most important things you can do for your health. Most adults should: Exercise for at least 150 minutes each week. The exercise should increase your heart rate and make you sweat (moderate-intensity exercise). Do strengthening exercises at least twice a week. This is in addition to the moderate-intensity exercise. Spend less time sitting. Even light physical activity can be beneficial. Watch cholesterol and blood lipids Have your blood tested for lipids and cholesterol at 61 years of age, then have this test every 5 years. Have your cholesterol levels checked more often if: Your lipid or cholesterol levels are high. You are older than 61 years of age. You are at high risk for heart disease. What should I know about cancer screening? Depending on your health history and family history, you may need to have  cancer screening at various ages. This may include screening for: Breast cancer. Cervical cancer. Colorectal cancer. Skin cancer. Lung cancer. What should I know about heart disease, diabetes, and high blood pressure? Blood pressure and heart disease High blood pressure causes heart disease and increases the risk of stroke. This is more likely to develop in people who have high blood pressure readings or are overweight. Have your blood pressure checked: Every 3-5 years if you are 31-4 years of age. Every year if you are 73 years old or older. Diabetes Have regular diabetes screenings. This checks your fasting blood sugar level. Have the screening done: Once every three years after age 34 if you are at a normal weight and have a low risk for diabetes. More often and at a younger age if you are overweight or have a high risk for diabetes. What should I know about preventing infection? Hepatitis B If you have a higher risk for hepatitis B, you should be screened for this virus. Talk with your health care provider to find out if you are at risk for hepatitis B infection. Hepatitis C Testing is recommended for: Everyone born from [birth year] through 1965. Anyone with known risk factors for hepatitis C. Sexually transmitted infections (STIs) Get screened for STIs, including gonorrhea and chlamydia, if: You are sexually active and are younger than 61 years of age. You are older than 61 years of age and your health care provider tells you that you are at risk for this type of infection. Your sexual activity has  changed since you were last screened, and you are at increased risk for chlamydia or gonorrhea. Ask your health care provider if you are at risk. Ask your health care provider about whether you are at high risk for HIV. Your health care provider may recommend a prescription medicine to help prevent HIV infection. If you choose to take medicine to prevent HIV, you should first get tested for HIV.  You should then be tested every 3 months for as long as you are taking the medicine. Pregnancy If you are about to stop having your period (premenopausal) and you may become pregnant, seek counseling before you get pregnant. Take 400 to 800 micrograms (mcg) of folic acid every day if you become pregnant. Ask for birth control (contraception) if you want to prevent pregnancy. Osteoporosis and menopause Osteoporosis is a disease in which the bones lose minerals and strength with aging. This can result in bone fractures. If you are 50 years old or older, or if you are at risk for osteoporosis and fractures, ask your health care provider if you should: Be screened for bone loss. Take a calcium or vitamin D supplement to lower your risk of fractures. Be given hormone replacement therapy (HRT) to treat symptoms of menopause. Follow these instructions at home: Alcohol use Do not drink alcohol if: Your health care provider tells you not to drink. You are pregnant, may be pregnant, or are planning to become pregnant. If you drink alcohol: Limit how much you have to: 0-1 drink a day. Know how much alcohol is in your drink. In the [place] one drink equals one 12 oz bottle of beer (355 mL), one 5 oz glass of wine (148 mL), or one 1 oz glass of hard liquor (44 mL). Lifestyle Do not use any products that contain nicotine or tobacco. These products include cigarettes, chewing tobacco, and vaping devices, such as e-cigarettes. If you need help quitting, ask your health care provider. Do not use street drugs. Do not share needles. Ask your health care provider for help if you need support or information about quitting drugs. General instructions Schedule regular health, dental, and eye exams. Stay current with your vaccines. Tell your health care provider if: You often feel depressed. You have ever been abused or do not feel safe at home. Summary Adopting a healthy lifestyle and getting preventive care  are important in promoting health and wellness. Follow your health care provider's instructions about healthy diet, exercising, and getting tested or screened for diseases. Follow your health care provider's instructions on monitoring your cholesterol and blood pressure. This information is not intended to replace advice given to you by your health care provider. Make sure you discuss any questions you have with your health care provider. Document Revised: 08/17/2020 Document Reviewed: 08/17/2020 Elsevier Patient Education  2023 [employer].

## 2021-12-14 ENCOUNTER — Ambulatory Visit (INDEPENDENT_AMBULATORY_CARE_PROVIDER_SITE_OTHER): Payer: BC Managed Care – PPO | Admitting: Internal Medicine

## 2021-12-14 VITALS — BP 120/76 | HR 76 | Temp 98.0°F | Ht 65.5 in | Wt 132.0 lb

## 2021-12-14 DIAGNOSIS — M810 Age-related osteoporosis without current pathological fracture: Secondary | ICD-10-CM | POA: Diagnosis not present

## 2021-12-14 DIAGNOSIS — R5383 Other fatigue: Secondary | ICD-10-CM | POA: Insufficient documentation

## 2021-12-14 DIAGNOSIS — Z0000 Encounter for general adult medical examination without abnormal findings: Secondary | ICD-10-CM | POA: Diagnosis not present

## 2021-12-14 DIAGNOSIS — R739 Hyperglycemia, unspecified: Secondary | ICD-10-CM

## 2021-12-14 DIAGNOSIS — E782 Mixed hyperlipidemia: Secondary | ICD-10-CM

## 2021-12-14 DIAGNOSIS — Z136 Encounter for screening for cardiovascular disorders: Secondary | ICD-10-CM | POA: Insufficient documentation

## 2021-12-14 LAB — CBC WITH DIFFERENTIAL/PLATELET
Basophils Absolute: 0 10*3/uL (ref 0.0–0.1)
Basophils Relative: 0.2 % (ref 0.0–3.0)
Eosinophils Absolute: 0.1 10*3/uL (ref 0.0–0.7)
Eosinophils Relative: 1.2 % (ref 0.0–5.0)
HCT: 42.9 % (ref 36.0–46.0)
Hemoglobin: 14.7 g/dL (ref 12.0–15.0)
Lymphocytes Relative: 29.1 % (ref 12.0–46.0)
Lymphs Abs: 1.3 10*3/uL (ref 0.7–4.0)
MCHC: 34.2 g/dL (ref 30.0–36.0)
MCV: 93 fl (ref 78.0–100.0)
Monocytes Absolute: 0.4 10*3/uL (ref 0.1–1.0)
Monocytes Relative: 9.1 % (ref 3.0–12.0)
Neutro Abs: 2.8 10*3/uL (ref 1.4–7.7)
Neutrophils Relative %: 60.4 % (ref 43.0–77.0)
Platelets: 287 10*3/uL (ref 150.0–400.0)
RBC: 4.62 Mil/uL (ref 3.87–5.11)
RDW: 13 % (ref 11.5–15.5)
WBC: 4.6 10*3/uL (ref 4.0–10.5)

## 2021-12-14 LAB — LIPID PANEL
Cholesterol: 234 mg/dL — ABNORMAL HIGH (ref 0–200)
HDL: 71.9 mg/dL (ref >39.00)
LDL Cholesterol: 144 mg/dL — ABNORMAL HIGH (ref 0–99)
NonHDL: 162.16
Total CHOL/HDL Ratio: 3
Triglycerides: 89 mg/dL (ref 0.0–149.0)
VLDL: 17.8 mg/dL (ref 0.0–40.0)

## 2021-12-14 LAB — COMPREHENSIVE METABOLIC PANEL
ALT: 14 U/L (ref 0–35)
AST: 21 U/L (ref 0–37)
Albumin: 4.5 g/dL (ref 3.5–5.2)
Alkaline Phosphatase: 57 U/L (ref 39–117)
BUN: 15 mg/dL (ref 6–23)
CO2: 28 mEq/L (ref 19–32)
Calcium: 10.1 mg/dL (ref 8.4–10.5)
Chloride: 102 mEq/L (ref 96–112)
Creatinine, Ser: 0.74 mg/dL (ref 0.40–1.20)
GFR: 87.69 mL/min (ref >60.00)
Glucose, Bld: 87 mg/dL (ref 70–99)
Potassium: 4.2 mEq/L (ref 3.5–5.1)
Sodium: 139 mEq/L (ref 135–145)
Total Bilirubin: 0.7 mg/dL (ref 0.2–1.2)
Total Protein: 8 g/dL (ref 6.0–8.3)

## 2021-12-14 LAB — TSH: TSH: 2.2 u[IU]/mL (ref 0.35–5.50)

## 2021-12-14 LAB — VITAMIN D 25 HYDROXY (VIT D DEFICIENCY, FRACTURES): VITD: 41.53 ng/mL (ref 30.00–100.00)

## 2021-12-14 MED ORDER — EPINEPHRINE 0.3 MG/0.3ML IJ SOAJ: 0.3000 mg | INTRAMUSCULAR | 3 refills | Status: DC | PRN

## 2021-12-14 MED ORDER — CALCIUM-MAGNESIUM-VITAMIN D 600-300-400 PO LIQD: ORAL | Status: AC

## 2021-12-14 NOTE — Assessment & Plan Note (Signed)
Chronic Regular exercise and healthy diet encouraged Check lipid panel, CMP, TSH Continue red yeast rice

## 2021-12-14 NOTE — Assessment & Plan Note (Signed)
Chronic A1c has remained normal-no family history of diabetes CMP

## 2021-12-14 NOTE — Assessment & Plan Note (Signed)
She does have hyperlipidemia and her grandmother had heart attack at age 61 She does worry about her risk She is taking the red yeast rice daily and that has helped her cholesterol some, but she does worry what her risk is We will order CT cardiac score test for further evaluation

## 2021-12-14 NOTE — Assessment & Plan Note (Addendum)
Chronic Has been on Fosamax in the past, but did not tolerate it secondary to abdominal pain Last DEXA showed osteopenia-spine excluded for degenerative changes Continue calcium, magnesium and vitamin D daily Stressed regular exercise-she will continue Check vitamin D level, CBC, CMP, TSH DEXA up-to-date-due 2025

## 2021-12-14 NOTE — Assessment & Plan Note (Signed)
New Secondary to COVID she had last October It is improving and expected to continue to improve Continue regular exercise Continue supplements-consider adding in B complex or multivitamin

## 2021-12-20 DIAGNOSIS — M9904 Segmental and somatic dysfunction of sacral region: Secondary | ICD-10-CM | POA: Diagnosis not present

## 2021-12-20 DIAGNOSIS — M9906 Segmental and somatic dysfunction of lower extremity: Secondary | ICD-10-CM | POA: Diagnosis not present

## 2021-12-20 DIAGNOSIS — M9903 Segmental and somatic dysfunction of lumbar region: Secondary | ICD-10-CM | POA: Diagnosis not present

## 2021-12-20 DIAGNOSIS — M9905 Segmental and somatic dysfunction of pelvic region: Secondary | ICD-10-CM | POA: Diagnosis not present

## 2022-01-03 NOTE — Progress Notes (Signed)
61 y.o. G0P0 Married White or Caucasian Not Hispanic or Latino female here for annual exam.  No vaginal bleeding. Not sexually active.   No bowel or bladder c/o.   She got covid in 10/22, still doesn't feel completely back to normal. Definitely improving. Still very tired. She is now back to walking 2 miles a day and doing yoga. Fatigue is improving.     No LMP recorded. Patient is postmenopausal.          Sexually active: No.  The current method of family planning is post menopausal status.    Exercising: Yes.     Walking and Yoga  Smoker:  no  Health Maintenance: Pap:   12/04/2017 WNL NEG HPV  History of abnormal Pap:  no MMG:  11/10/21 density C Bi-rads 0 11/18/21 diag tomo birads 1 neg  BMD:   08/02/21 Osteopenic  Colonoscopy:  05/02/13 f/u 10 years  TDaP:  05/02/13 f/u 10 yeas  Gardasil: n/a   reports that she has never smoked. She has never used smokeless tobacco. She reports that she does not drink alcohol and does not use drugs. Grown son, local. Mom is in assisted living.   Past Medical History:  Diagnosis Date   Allergy    Angioedema    eval by rheum and allergy dr Irena Cords now   Chicken pox    H/O angioedema    neg rheum pos rf dr Dareen Piano 2011   Infertility    Mitral valve prolapse    echo years ago fainting in childhood    Osteopenia     Past Surgical History:  Procedure Laterality Date   LAPAROSCOPIC ABDOMINAL EXPLORATION  1992   Found minimal endometriosis   WISDOM TOOTH EXTRACTION      Current Outpatient Medications  Medication Sig Dispense Refill   Calcium-Magnesium-Vitamin D 600-300-400 LIQD Taking bone supplement daily     EPINEPHrine 0.3 mg/0.3 mL IJ SOAJ injection Inject 0.3 mg into the muscle as needed. 1 each 3   Fexofenadine HCl (ALLEGRA ALLERGY PO) Take by mouth as needed.     Red Yeast Rice Extract (RED YEAST RICE PO) Take by mouth.     No current facility-administered medications for this visit.    Family History  Problem Relation Age of  Onset   Hyperlipidemia Mother    Hypertension Mother    Arthritis Mother        hands    Endometrial cancer Mother 68       surgery, radiation   Vaginal cancer Mother    Esophageal cancer Father        Esophageal with metastisis to the liver   Polycystic kidney disease Brother        congental cr 5 range, s/p transplant   Hypertension Brother    Colon cancer Neg Hx    Rectal cancer Neg Hx    Stomach cancer Neg Hx     Review of Systems  All other systems reviewed and are negative.   Exam:   BP 132/78   Pulse 66   Ht 5' 4.5" (1.638 m)   Wt 133 lb 6.4 oz (60.5 kg)   SpO2 100%   BMI 22.54 kg/m   Weight change: @WEIGHTCHANGE @ Height:   Height: 5' 4.5" (163.8 cm)  Ht Readings from Last 3 Encounters:  01/11/22 5' 4.5" (1.638 m)  12/14/21 5' 5.5" (1.664 m)  12/29/20 5' 5.5" (1.664 m)    General appearance: alert, cooperative and appears stated age Head: Normocephalic,  without obvious abnormality, atraumatic Neck: no adenopathy, supple, symmetrical, trachea midline and thyroid normal to inspection and palpation Lungs: clear to auscultation bilaterally Cardiovascular: regular rate and rhythm Breasts: normal appearance, no masses or tenderness Abdomen: soft, non-tender; non distended,  no masses,  no organomegaly Extremities: extremities normal, atraumatic, no cyanosis or edema Skin: Skin color, texture, turgor normal. No rashes or lesions Lymph nodes: Cervical, supraclavicular, and axillary nodes normal. No abnormal inguinal nodes palpated Neurologic: Grossly normal   Pelvic: External genitalia:  no lesions              Urethra:  normal appearing urethra with no masses, tenderness or lesions              Bartholins and Skenes: normal                 Vagina: normal appearing vagina with normal color and discharge, no lesions              Cervix: no lesions and flush with vagina, high in vagina on patients left               Bimanual Exam:  Uterus:  normal size, contour,  position, consistency, mobility, non-tender              Adnexa: no mass, fullness, tenderness               Rectovaginal: Confirms               Anus:  normal sphincter tone, no lesions    1. Well woman exam Discussed breast self exam Discussed calcium and vit D intake Pap next year Mammogram and colonoscopy are UTD Labs with primary F/U DEXA with primary

## 2022-01-05 ENCOUNTER — Ambulatory Visit: Payer: BC Managed Care – PPO | Admitting: Obstetrics and Gynecology

## 2022-01-11 ENCOUNTER — Encounter: Payer: Self-pay | Admitting: Obstetrics and Gynecology

## 2022-01-11 ENCOUNTER — Ambulatory Visit (INDEPENDENT_AMBULATORY_CARE_PROVIDER_SITE_OTHER): Payer: BC Managed Care – PPO | Admitting: Obstetrics and Gynecology

## 2022-01-11 VITALS — BP 132/78 | HR 66 | Ht 64.5 in | Wt 133.4 lb

## 2022-01-11 DIAGNOSIS — Z01419 Encounter for gynecological examination (general) (routine) without abnormal findings: Secondary | ICD-10-CM | POA: Diagnosis not present

## 2022-01-11 NOTE — Patient Instructions (Signed)

## 2022-01-17 DIAGNOSIS — M9906 Segmental and somatic dysfunction of lower extremity: Secondary | ICD-10-CM | POA: Diagnosis not present

## 2022-01-17 DIAGNOSIS — M9903 Segmental and somatic dysfunction of lumbar region: Secondary | ICD-10-CM | POA: Diagnosis not present

## 2022-01-17 DIAGNOSIS — M9905 Segmental and somatic dysfunction of pelvic region: Secondary | ICD-10-CM | POA: Diagnosis not present

## 2022-01-17 DIAGNOSIS — M9904 Segmental and somatic dysfunction of sacral region: Secondary | ICD-10-CM | POA: Diagnosis not present

## 2022-01-19 ENCOUNTER — Ambulatory Visit (HOSPITAL_BASED_OUTPATIENT_CLINIC_OR_DEPARTMENT_OTHER)
Admission: RE | Admit: 2022-01-19 | Discharge: 2022-01-19 | Disposition: A | Discharge status: Home / Self Care | Payer: BC Managed Care – PPO | Source: Ambulatory Visit | Attending: Internal Medicine | Admitting: Internal Medicine

## 2022-01-19 DIAGNOSIS — Z136 Encounter for screening for cardiovascular disorders: Secondary | ICD-10-CM | POA: Insufficient documentation

## 2022-01-23 ENCOUNTER — Encounter: Payer: Self-pay | Admitting: Internal Medicine

## 2022-01-23 DIAGNOSIS — R931 Abnormal findings on diagnostic imaging of heart and coronary circulation: Secondary | ICD-10-CM | POA: Insufficient documentation

## 2022-01-24 ENCOUNTER — Encounter: Payer: Self-pay | Admitting: Internal Medicine

## 2022-02-17 ENCOUNTER — Ambulatory Visit (INDEPENDENT_AMBULATORY_CARE_PROVIDER_SITE_OTHER): Payer: BC Managed Care – PPO

## 2022-02-17 ENCOUNTER — Other Ambulatory Visit: Payer: Self-pay

## 2022-02-17 DIAGNOSIS — Z23 Encounter for immunization: Secondary | ICD-10-CM | POA: Diagnosis not present

## 2022-02-17 NOTE — Progress Notes (Signed)
2nd shingles dose administered today

## 2022-02-21 DIAGNOSIS — M9906 Segmental and somatic dysfunction of lower extremity: Secondary | ICD-10-CM | POA: Diagnosis not present

## 2022-02-21 DIAGNOSIS — M9905 Segmental and somatic dysfunction of pelvic region: Secondary | ICD-10-CM | POA: Diagnosis not present

## 2022-02-21 DIAGNOSIS — M9903 Segmental and somatic dysfunction of lumbar region: Secondary | ICD-10-CM | POA: Diagnosis not present

## 2022-02-21 DIAGNOSIS — M9904 Segmental and somatic dysfunction of sacral region: Secondary | ICD-10-CM | POA: Diagnosis not present

## 2022-03-28 DIAGNOSIS — M9903 Segmental and somatic dysfunction of lumbar region: Secondary | ICD-10-CM | POA: Diagnosis not present

## 2022-03-28 DIAGNOSIS — M9904 Segmental and somatic dysfunction of sacral region: Secondary | ICD-10-CM | POA: Diagnosis not present

## 2022-03-28 DIAGNOSIS — M9906 Segmental and somatic dysfunction of lower extremity: Secondary | ICD-10-CM | POA: Diagnosis not present

## 2022-03-28 DIAGNOSIS — M9905 Segmental and somatic dysfunction of pelvic region: Secondary | ICD-10-CM | POA: Diagnosis not present

## 2022-04-25 DIAGNOSIS — M9904 Segmental and somatic dysfunction of sacral region: Secondary | ICD-10-CM | POA: Diagnosis not present

## 2022-04-25 DIAGNOSIS — M9903 Segmental and somatic dysfunction of lumbar region: Secondary | ICD-10-CM | POA: Diagnosis not present

## 2022-04-25 DIAGNOSIS — M9905 Segmental and somatic dysfunction of pelvic region: Secondary | ICD-10-CM | POA: Diagnosis not present

## 2022-04-25 DIAGNOSIS — M9906 Segmental and somatic dysfunction of lower extremity: Secondary | ICD-10-CM | POA: Diagnosis not present

## 2022-05-23 DIAGNOSIS — M9906 Segmental and somatic dysfunction of lower extremity: Secondary | ICD-10-CM | POA: Diagnosis not present

## 2022-05-23 DIAGNOSIS — M9905 Segmental and somatic dysfunction of pelvic region: Secondary | ICD-10-CM | POA: Diagnosis not present

## 2022-05-23 DIAGNOSIS — M9903 Segmental and somatic dysfunction of lumbar region: Secondary | ICD-10-CM | POA: Diagnosis not present

## 2022-05-23 DIAGNOSIS — M9904 Segmental and somatic dysfunction of sacral region: Secondary | ICD-10-CM | POA: Diagnosis not present

## 2022-06-01 ENCOUNTER — Ambulatory Visit: Payer: BC Managed Care – PPO | Admitting: Family Medicine

## 2022-06-01 ENCOUNTER — Encounter: Payer: Self-pay | Admitting: Family Medicine

## 2022-06-01 VITALS — BP 132/80 | HR 95 | Temp 98.2°F | Ht 64.5 in | Wt 138.8 lb

## 2022-06-01 DIAGNOSIS — R42 Dizziness and giddiness: Secondary | ICD-10-CM | POA: Diagnosis not present

## 2022-06-01 NOTE — Progress Notes (Signed)
Established Patient Office Visit  Subjective   Patient ID: Jenny Haley, female    DOB: July 29, 1960  Age: 62 y.o. MRN: [id number]  Chief Complaint  Patient presents with   Dizziness    HPI   Jenny Haley is seen as a work in today with chief complaint of some dizziness.  First noted Monday when  turning her head to the right,  she noticed what sounded like vertigo.  This resolved after sitting up.  Then yesterday morning she had some recurrent similar transient dizziness with turning to the right and left.  No associated nausea or vomiting.  Symptoms were very transient.  No visual changes.  No acute or progressive headache.  Denied any focal weakness, confusion, speech changes, dysphagia, or any ataxia.  No history of similar symptoms in the past.  She does have frequent allergies and takes Allegra as needed.  Does not take any regular prescription medications.  Does take some red yeast rice extract for hyperlipidemia.  Symptoms have been stable today.  Past Medical History:  Diagnosis Date   Allergy    Angioedema    eval by rheum and allergy dr Jenny Haley now   Chicken pox    H/O angioedema    neg rheum pos rf dr Jenny Haley 2011   Infertility    Mitral valve prolapse    echo years ago fainting in childhood    Osteopenia    Past Surgical History:  Procedure Laterality Date   LAPAROSCOPIC ABDOMINAL EXPLORATION  1992   Found minimal endometriosis   WISDOM TOOTH EXTRACTION      reports that she has never smoked. She has never used smokeless tobacco. She reports that she does not drink alcohol and does not use drugs. family history includes Arthritis in her mother; Endometrial cancer (age of onset: 25) in her mother; Esophageal cancer in her father; Hyperlipidemia in her mother; Hypertension in her brother and mother; Polycystic kidney disease in her brother; Vaginal cancer in her mother. Allergies  Allergen Reactions   Azithromycin Diarrhea and Nausea Only    Penicillins     REACTION: Rash   Fosamax [Alendronate] Other (See Comments)    Abdominal pain    Review of Systems  Constitutional:  Negative for chills and fever.  HENT:  Negative for hearing loss and tinnitus.   Eyes:  Negative for blurred vision and double vision.  Cardiovascular:  Negative for chest pain.  Neurological:  Positive for dizziness. Negative for speech change, focal weakness, loss of consciousness and headaches.      Objective:     BP 132/80 (BP Location: Left Arm, Patient Position: Sitting, Cuff Size: Normal)   Pulse 95   Temp 98.2 F (36.8 C) (Oral)   Ht 5' 4.5" (1.638 m)   Wt 138 lb 12.8 oz (63 kg)   SpO2 98%   BMI 23.46 kg/m    Physical Exam Vitals reviewed.  Constitutional:      General: She is not in acute distress.    Appearance: Normal appearance.  HENT:     Head: Normocephalic and atraumatic.     Right Ear: Tympanic membrane normal.     Left Ear: Tympanic membrane normal.  Eyes:     Extraocular Movements: Extraocular movements intact.     Pupils: Pupils are equal, round, and reactive to light.  Cardiovascular:     Rate and Rhythm: Normal rate and regular rhythm.  Pulmonary:     Effort: Pulmonary effort is normal.  Breath sounds: Normal breath sounds.  Neurological:     General: No focal deficit present.     Mental Status: She is alert.     Cranial Nerves: No cranial nerve deficit.     Motor: No weakness.     Coordination: Coordination normal.     Gait: Gait normal.     Comments: No visible nystagmus.   Vertigo symptoms not produced with sitting to supine with her to right or left.        No results found for any visits on 06/01/22.    The 10-year ASCVD risk score (Arnett DK, et al., 2019) is: 3.7%    Assessment & Plan:   Transient vertigo symptoms with onset Monday.  Not reproduced today on exam.  Does not have any red flags such as progressive headache, visual change, dysphagia, speech change, ataxia, etc.  We explained  that most vertigo is benign and self-limited.  She did have bidirectional component.  -Reviewed signs and symptoms to watch for -Discussed very limited role medications in treating most vertigo -Discussed possible Epley maneuvers if she has any recurrent symptoms  Jenny Littler, MD

## 2022-06-03 ENCOUNTER — Ambulatory Visit: Payer: BC Managed Care – PPO | Admitting: Internal Medicine

## 2022-06-20 DIAGNOSIS — M9904 Segmental and somatic dysfunction of sacral region: Secondary | ICD-10-CM | POA: Diagnosis not present

## 2022-06-20 DIAGNOSIS — M9906 Segmental and somatic dysfunction of lower extremity: Secondary | ICD-10-CM | POA: Diagnosis not present

## 2022-06-20 DIAGNOSIS — M9905 Segmental and somatic dysfunction of pelvic region: Secondary | ICD-10-CM | POA: Diagnosis not present

## 2022-06-20 DIAGNOSIS — M9903 Segmental and somatic dysfunction of lumbar region: Secondary | ICD-10-CM | POA: Diagnosis not present

## 2022-06-23 DIAGNOSIS — D225 Melanocytic nevi of trunk: Secondary | ICD-10-CM | POA: Diagnosis not present

## 2022-06-23 DIAGNOSIS — L918 Other hypertrophic disorders of the skin: Secondary | ICD-10-CM | POA: Diagnosis not present

## 2022-06-23 DIAGNOSIS — L821 Other seborrheic keratosis: Secondary | ICD-10-CM | POA: Diagnosis not present

## 2022-06-23 DIAGNOSIS — I788 Other diseases of capillaries: Secondary | ICD-10-CM | POA: Diagnosis not present

## 2022-07-18 DIAGNOSIS — M9903 Segmental and somatic dysfunction of lumbar region: Secondary | ICD-10-CM | POA: Diagnosis not present

## 2022-07-18 DIAGNOSIS — M9906 Segmental and somatic dysfunction of lower extremity: Secondary | ICD-10-CM | POA: Diagnosis not present

## 2022-07-18 DIAGNOSIS — M9904 Segmental and somatic dysfunction of sacral region: Secondary | ICD-10-CM | POA: Diagnosis not present

## 2022-07-18 DIAGNOSIS — M9905 Segmental and somatic dysfunction of pelvic region: Secondary | ICD-10-CM | POA: Diagnosis not present

## 2022-08-15 DIAGNOSIS — M9906 Segmental and somatic dysfunction of lower extremity: Secondary | ICD-10-CM | POA: Diagnosis not present

## 2022-08-15 DIAGNOSIS — M9903 Segmental and somatic dysfunction of lumbar region: Secondary | ICD-10-CM | POA: Diagnosis not present

## 2022-08-15 DIAGNOSIS — M9905 Segmental and somatic dysfunction of pelvic region: Secondary | ICD-10-CM | POA: Diagnosis not present

## 2022-08-15 DIAGNOSIS — M9904 Segmental and somatic dysfunction of sacral region: Secondary | ICD-10-CM | POA: Diagnosis not present

## 2022-09-12 DIAGNOSIS — M9906 Segmental and somatic dysfunction of lower extremity: Secondary | ICD-10-CM | POA: Diagnosis not present

## 2022-09-12 DIAGNOSIS — M9904 Segmental and somatic dysfunction of sacral region: Secondary | ICD-10-CM | POA: Diagnosis not present

## 2022-09-12 DIAGNOSIS — M9903 Segmental and somatic dysfunction of lumbar region: Secondary | ICD-10-CM | POA: Diagnosis not present

## 2022-09-12 DIAGNOSIS — M9905 Segmental and somatic dysfunction of pelvic region: Secondary | ICD-10-CM | POA: Diagnosis not present

## 2022-09-27 ENCOUNTER — Other Ambulatory Visit: Payer: Self-pay | Admitting: Internal Medicine

## 2022-09-27 DIAGNOSIS — Z1231 Encounter for screening mammogram for malignant neoplasm of breast: Secondary | ICD-10-CM

## 2022-10-10 DIAGNOSIS — M9906 Segmental and somatic dysfunction of lower extremity: Secondary | ICD-10-CM | POA: Diagnosis not present

## 2022-10-10 DIAGNOSIS — M9904 Segmental and somatic dysfunction of sacral region: Secondary | ICD-10-CM | POA: Diagnosis not present

## 2022-10-10 DIAGNOSIS — M9903 Segmental and somatic dysfunction of lumbar region: Secondary | ICD-10-CM | POA: Diagnosis not present

## 2022-10-10 DIAGNOSIS — M9905 Segmental and somatic dysfunction of pelvic region: Secondary | ICD-10-CM | POA: Diagnosis not present

## 2022-11-15 ENCOUNTER — Ambulatory Visit: Admission: RE | Admit: 2022-11-15 | Payer: BC Managed Care – PPO | Source: Ambulatory Visit

## 2022-11-15 DIAGNOSIS — Z1231 Encounter for screening mammogram for malignant neoplasm of breast: Secondary | ICD-10-CM | POA: Diagnosis not present

## 2022-11-24 ENCOUNTER — Encounter (INDEPENDENT_AMBULATORY_CARE_PROVIDER_SITE_OTHER): Payer: Self-pay

## 2022-12-06 DIAGNOSIS — M9904 Segmental and somatic dysfunction of sacral region: Secondary | ICD-10-CM | POA: Diagnosis not present

## 2022-12-06 DIAGNOSIS — M9906 Segmental and somatic dysfunction of lower extremity: Secondary | ICD-10-CM | POA: Diagnosis not present

## 2022-12-06 DIAGNOSIS — M9905 Segmental and somatic dysfunction of pelvic region: Secondary | ICD-10-CM | POA: Diagnosis not present

## 2022-12-06 DIAGNOSIS — M9903 Segmental and somatic dysfunction of lumbar region: Secondary | ICD-10-CM | POA: Diagnosis not present

## 2022-12-19 ENCOUNTER — Encounter: Payer: Self-pay | Admitting: Internal Medicine

## 2022-12-19 NOTE — Progress Notes (Unsigned)
Subjective:    Patient ID: Jenny Haley, female    DOB: 1960-08-19, 62 y.o.   MRN: [id number]      HPI Jenny Haley is here for a Physical exam and her chronic medical problems.    Earlier this year she had severe case of vertigo while laying in bed - her head did not feel right when she got up. She had severe vertigo when laying on her side.  Had two episodes - has not had any symptoms since then.     Medications and allergies reviewed with patient and updated if appropriate.  Current Outpatient Medications on File Prior to Visit  Medication Sig Dispense Refill   Calcium-Magnesium-Vitamin D 600-300-400 LIQD Taking bone supplement daily     Fexofenadine HCl (ALLEGRA ALLERGY PO) Take by mouth as needed.     Red Yeast Rice Extract (RED YEAST RICE PO) Take by mouth.     No current facility-administered medications on file prior to visit.    Review of Systems  Constitutional:  Negative for fever.  Eyes:  Negative for visual disturbance.  Respiratory:  Negative for cough, shortness of breath and wheezing.   Cardiovascular:  Negative for chest pain, palpitations and leg swelling.  Gastrointestinal:  Negative for abdominal pain, blood in stool, constipation and diarrhea.       No gerd  Genitourinary:  Negative for dysuria.  Musculoskeletal:  Negative for arthralgias and back pain.  Skin:  Negative for rash.  Neurological:  Negative for dizziness, light-headedness and headaches.  Psychiatric/Behavioral:  Negative for dysphoric mood. The patient is not nervous/anxious.        Objective:   Vitals:   12/20/22 1045  BP: 120/80  Pulse: 72  Temp: 98 F (36.7 C)  SpO2: 98%   Filed Weights   12/20/22 1045  Weight: 140 lb (63.5 kg)   Body mass index is 23.66 kg/m.  BP Readings from Last 3 Encounters:  12/20/22 120/80  06/01/22 132/80  01/11/22 132/78    Wt Readings from Last 3 Encounters:  12/20/22 140 lb (63.5 kg)  06/01/22 138 lb 12.8 oz (63 kg)   01/11/22 133 lb 6.4 oz (60.5 kg)       Physical Exam Constitutional: She appears well-developed and well-nourished. No distress.  HENT:  Head: Normocephalic and atraumatic.  Right Ear: External ear normal. Normal ear canal and TM Left Ear: External ear normal.  Normal ear canal and TM Mouth/Throat: Oropharynx is clear and moist.  Eyes: Conjunctivae normal.  Neck: Neck supple. No tracheal deviation present. No thyromegaly present.  No carotid bruit  Cardiovascular: Normal rate, regular rhythm and normal heart sounds.   No murmur heard.  No edema. Pulmonary/Chest: Effort normal and breath sounds normal. No respiratory distress. She has no wheezes. She has no rales.  Breast: deferred   Abdominal: Soft. She exhibits no distension. There is no tenderness.  Lymphadenopathy: She has no cervical adenopathy.  Skin: Skin is warm and dry. She is not diaphoretic.  Psychiatric: She has a normal mood and affect. Her behavior is normal.     Lab Results  Component Value Date   WBC 4.6 12/14/2021   HGB 14.7 12/14/2021   HCT 42.9 12/14/2021   PLT 287.0 12/14/2021   GLUCOSE 87 12/14/2021   CHOL 234 (H) 12/14/2021   TRIG 89.0 12/14/2021   HDL 71.90 12/14/2021   LDLDIRECT 162.2 03/13/2013   LDLCALC 144 (H) 12/14/2021   ALT 14 12/14/2021   AST 21 12/14/2021  NA 139 12/14/2021   K 4.2 12/14/2021   CL 102 12/14/2021   CREATININE 0.74 12/14/2021   BUN 15 12/14/2021   CO2 28 12/14/2021   TSH 2.20 12/14/2021   HGBA1C 5.5 12/11/2020         Assessment & Plan:   Physical exam: Screening blood work  ordered Exercise  walking 2 miles a day  at least 4 days a week Weight  normal Substance abuse  none   Reviewed recommended immunizations.   Health Maintenance  Topic Date Due   PAP SMEAR-Modifier  12/05/2022   COVID-19 Vaccine (3 - 2023-24 season) 01/05/2023 (Originally 12/11/2022)   INFLUENZA VACCINE  07/10/2023 (Originally 11/10/2022)   Colonoscopy  05/03/2023   DEXA SCAN   08/03/2023   MAMMOGRAM  11/14/2024   DTaP/Tdap/Td (3 - Td or Tdap) 08/17/2025   Hepatitis C Screening  Completed   HIV Screening  Completed   Zoster Vaccines- Shingrix  Completed   HPV VACCINES  Aged Out     Will schedule GYN visit     See Problem List for Assessment and Plan of chronic medical problems.

## 2022-12-19 NOTE — Patient Instructions (Addendum)
Blood work was ordered.   The lab is on the first floor.    Medications changes include :   none    A referral was ordered and someone will call you to schedule an appointment.     Return in about 1 year (around 12/20/2023) for Physical Exam.    Health Maintenance, Female Adopting a healthy lifestyle and getting preventive care are important in promoting health and wellness. Ask your health care provider about: The right schedule for you to have regular tests and exams. Things you can do on your own to prevent diseases and keep yourself healthy. What should I know about diet, weight, and exercise? Eat a healthy diet  Eat a diet that includes plenty of vegetables, fruits, low-fat dairy products, and lean protein. Do not eat a lot of foods that are high in solid fats, added sugars, or sodium. Maintain a healthy weight Body mass index (BMI) is used to identify weight problems. It estimates body fat based on height and weight. Your health care provider can help determine your BMI and help you achieve or maintain a healthy weight. Get regular exercise Get regular exercise. This is one of the most important things you can do for your health. Most adults should: Exercise for at least 150 minutes each week. The exercise should increase your heart rate and make you sweat (moderate-intensity exercise). Do strengthening exercises at least twice a week. This is in addition to the moderate-intensity exercise. Spend less time sitting. Even light physical activity can be beneficial. Watch cholesterol and blood lipids Have your blood tested for lipids and cholesterol at 62 years of age, then have this test every 5 years. Have your cholesterol levels checked more often if: Your lipid or cholesterol levels are high. You are older than 62 years of age. You are at high risk for heart disease. What should I know about cancer screening? Depending on your health history and family history,  you may need to have cancer screening at various ages. This may include screening for: Breast cancer. Cervical cancer. Colorectal cancer. Skin cancer. Lung cancer. What should I know about heart disease, diabetes, and high blood pressure? Blood pressure and heart disease High blood pressure causes heart disease and increases the risk of stroke. This is more likely to develop in people who have high blood pressure readings or are overweight. Have your blood pressure checked: Every 3-5 years if you are 62-59 years of age. Every year if you are 62 years old or older. Diabetes Have regular diabetes screenings. This checks your fasting blood sugar level. Have the screening done: Once every three years after age 35 if you are at a normal weight and have a low risk for diabetes. More often and at a younger age if you are overweight or have a high risk for diabetes. What should I know about preventing infection? Hepatitis B If you have a higher risk for hepatitis B, you should be screened for this virus. Talk with your health care provider to find out if you are at risk for hepatitis B infection. Hepatitis C Testing is recommended for: Everyone born from [birth year] through 1965. Anyone with known risk factors for hepatitis C. Sexually transmitted infections (STIs) Get screened for STIs, including gonorrhea and chlamydia, if: You are sexually active and are younger than 62 years of age. You are older than 62 years of age and your health care provider tells you that you are at risk for this  type of infection. Your sexual activity has changed since you were last screened, and you are at increased risk for chlamydia or gonorrhea. Ask your health care provider if you are at risk. Ask your health care provider about whether you are at high risk for HIV. Your health care provider may recommend a prescription medicine to help prevent HIV infection. If you choose to take medicine to prevent HIV, you should  first get tested for HIV. You should then be tested every 3 months for as long as you are taking the medicine. Pregnancy If you are about to stop having your period (premenopausal) and you may become pregnant, seek counseling before you get pregnant. Take 400 to 800 micrograms (mcg) of folic acid every day if you become pregnant. Ask for birth control (contraception) if you want to prevent pregnancy. Osteoporosis and menopause Osteoporosis is a disease in which the bones lose minerals and strength with aging. This can result in bone fractures. If you are 86 years old or older, or if you are at risk for osteoporosis and fractures, ask your health care provider if you should: Be screened for bone loss. Take a calcium or vitamin D supplement to lower your risk of fractures. Be given hormone replacement therapy (HRT) to treat symptoms of menopause. Follow these instructions at home: Alcohol use Do not drink alcohol if: Your health care provider tells you not to drink. You are pregnant, may be pregnant, or are planning to become pregnant. If you drink alcohol: Limit how much you have to: 0-1 drink a day. Know how much alcohol is in your drink. In the [place] one drink equals one 12 oz bottle of beer (355 mL), one 5 oz glass of wine (148 mL), or one 1 oz glass of hard liquor (44 mL). Lifestyle Do not use any products that contain nicotine or tobacco. These products include cigarettes, chewing tobacco, and vaping devices, such as e-cigarettes. If you need help quitting, ask your health care provider. Do not use street drugs. Do not share needles. Ask your health care provider for help if you need support or information about quitting drugs. General instructions Schedule regular health, dental, and eye exams. Stay current with your vaccines. Tell your health care provider if: You often feel depressed. You have ever been abused or do not feel safe at home. Summary Adopting a healthy lifestyle  and getting preventive care are important in promoting health and wellness. Follow your health care provider's instructions about healthy diet, exercising, and getting tested or screened for diseases. Follow your health care provider's instructions on monitoring your cholesterol and blood pressure. This information is not intended to replace advice given to you by your health care provider. Make sure you discuss any questions you have with your health care provider. Document Revised: 08/17/2020 Document Reviewed: 08/17/2020 Elsevier Patient Education  2024 [employer].

## 2022-12-20 ENCOUNTER — Encounter: Payer: Self-pay | Admitting: Internal Medicine

## 2022-12-20 ENCOUNTER — Ambulatory Visit (INDEPENDENT_AMBULATORY_CARE_PROVIDER_SITE_OTHER): Payer: BC Managed Care – PPO | Admitting: Internal Medicine

## 2022-12-20 VITALS — BP 120/80 | HR 72 | Temp 98.0°F | Ht 64.5 in | Wt 140.0 lb

## 2022-12-20 DIAGNOSIS — Z0000 Encounter for general adult medical examination without abnormal findings: Secondary | ICD-10-CM

## 2022-12-20 DIAGNOSIS — R739 Hyperglycemia, unspecified: Secondary | ICD-10-CM

## 2022-12-20 DIAGNOSIS — M810 Age-related osteoporosis without current pathological fracture: Secondary | ICD-10-CM

## 2022-12-20 DIAGNOSIS — E782 Mixed hyperlipidemia: Secondary | ICD-10-CM

## 2022-12-20 LAB — LIPID PANEL
Cholesterol: 231 mg/dL — ABNORMAL HIGH (ref 0–200)
HDL: 80.5 mg/dL (ref >39.00)
LDL Cholesterol: 131 mg/dL — ABNORMAL HIGH (ref 0–99)
NonHDL: 150.67
Total CHOL/HDL Ratio: 3
Triglycerides: 98 mg/dL (ref 0.0–149.0)
VLDL: 19.6 mg/dL (ref 0.0–40.0)

## 2022-12-20 LAB — COMPREHENSIVE METABOLIC PANEL
ALT: 31 U/L (ref 0–35)
AST: 22 U/L (ref 0–37)
Albumin: 4.4 g/dL (ref 3.5–5.2)
Alkaline Phosphatase: 54 U/L (ref 39–117)
BUN: 16 mg/dL (ref 6–23)
CO2: 27 meq/L (ref 19–32)
Calcium: 10.1 mg/dL (ref 8.4–10.5)
Chloride: 104 meq/L (ref 96–112)
Creatinine, Ser: 0.77 mg/dL (ref 0.40–1.20)
GFR: 83.01 mL/min (ref >60.00)
Glucose, Bld: 87 mg/dL (ref 70–99)
Potassium: 4.6 meq/L (ref 3.5–5.1)
Sodium: 139 meq/L (ref 135–145)
Total Bilirubin: 0.7 mg/dL (ref 0.2–1.2)
Total Protein: 7.8 g/dL (ref 6.0–8.3)

## 2022-12-20 LAB — CBC WITH DIFFERENTIAL/PLATELET
Basophils Absolute: 0 10*3/uL (ref 0.0–0.1)
Basophils Relative: 0.3 % (ref 0.0–3.0)
Eosinophils Absolute: 0.1 10*3/uL (ref 0.0–0.7)
Eosinophils Relative: 1.8 % (ref 0.0–5.0)
HCT: 42.4 % (ref 36.0–46.0)
Hemoglobin: 14.1 g/dL (ref 12.0–15.0)
Lymphocytes Relative: 33.9 % (ref 12.0–46.0)
Lymphs Abs: 1.5 10*3/uL (ref 0.7–4.0)
MCHC: 33.2 g/dL (ref 30.0–36.0)
MCV: 94.9 fl (ref 78.0–100.0)
Monocytes Absolute: 0.4 10*3/uL (ref 0.1–1.0)
Monocytes Relative: 10.2 % (ref 3.0–12.0)
Neutro Abs: 2.3 10*3/uL (ref 1.4–7.7)
Neutrophils Relative %: 53.8 % (ref 43.0–77.0)
Platelets: 331 10*3/uL (ref 150.0–400.0)
RBC: 4.47 Mil/uL (ref 3.87–5.11)
RDW: 13.3 % (ref 11.5–15.5)
WBC: 4.3 10*3/uL (ref 4.0–10.5)

## 2022-12-20 LAB — VITAMIN D 25 HYDROXY (VIT D DEFICIENCY, FRACTURES): VITD: 39.13 ng/mL (ref 30.00–100.00)

## 2022-12-20 LAB — TSH: TSH: 2.17 u[IU]/mL (ref 0.35–5.50)

## 2022-12-20 MED ORDER — EPINEPHRINE 0.3 MG/0.3ML IJ SOAJ: 0.3000 mg | INTRAMUSCULAR | 3 refills | Status: DC | PRN

## 2022-12-20 NOTE — Assessment & Plan Note (Signed)
Chronic A1c has remained normal-no family history of diabetes CMP

## 2022-12-20 NOTE — Assessment & Plan Note (Signed)
Chronic Regular exercise and healthy diet  Check lipid panel, CMP, TSH Continue red yeast rice

## 2022-12-20 NOTE — Assessment & Plan Note (Signed)
Chronic On Fosamax in the past, but did not tolerate it  Last DEXA showed osteopenia-spine excluded for degenerative changes Continue calcium, magnesium and vitamin D daily Continue regular exercise Check vitamin D level, CBC, CMP, TSH DEXA up-to-date-due 2025

## 2023-01-02 DIAGNOSIS — M9904 Segmental and somatic dysfunction of sacral region: Secondary | ICD-10-CM | POA: Diagnosis not present

## 2023-01-02 DIAGNOSIS — M9906 Segmental and somatic dysfunction of lower extremity: Secondary | ICD-10-CM | POA: Diagnosis not present

## 2023-01-02 DIAGNOSIS — M9905 Segmental and somatic dysfunction of pelvic region: Secondary | ICD-10-CM | POA: Diagnosis not present

## 2023-01-02 DIAGNOSIS — M9903 Segmental and somatic dysfunction of lumbar region: Secondary | ICD-10-CM | POA: Diagnosis not present

## 2023-01-18 ENCOUNTER — Ambulatory Visit: Payer: BC Managed Care – PPO | Admitting: Obstetrics and Gynecology

## 2023-01-26 DIAGNOSIS — L570 Actinic keratosis: Secondary | ICD-10-CM | POA: Diagnosis not present

## 2023-01-30 DIAGNOSIS — M9904 Segmental and somatic dysfunction of sacral region: Secondary | ICD-10-CM | POA: Diagnosis not present

## 2023-01-30 DIAGNOSIS — M9906 Segmental and somatic dysfunction of lower extremity: Secondary | ICD-10-CM | POA: Diagnosis not present

## 2023-01-30 DIAGNOSIS — M9903 Segmental and somatic dysfunction of lumbar region: Secondary | ICD-10-CM | POA: Diagnosis not present

## 2023-01-30 DIAGNOSIS — M9905 Segmental and somatic dysfunction of pelvic region: Secondary | ICD-10-CM | POA: Diagnosis not present

## 2023-02-03 ENCOUNTER — Telehealth: Payer: Self-pay | Admitting: Internal Medicine

## 2023-02-03 ENCOUNTER — Other Ambulatory Visit: Payer: Self-pay

## 2023-02-03 MED ORDER — EPINEPHRINE 0.3 MG/0.3ML IJ SOAJ: 0.3000 mg | INTRAMUSCULAR | 3 refills | Status: DC | PRN

## 2023-02-03 NOTE — Telephone Encounter (Signed)
Refill sent to [facility] today.  Original was sent to [facility].

## 2023-02-03 NOTE — Telephone Encounter (Signed)
Prescription Request  02/03/2023  LOV: 12/20/2022  What is the name of the medication or equipment? EPINEPHrine 0.3 mg/0.3 mL IJ SOAJ injection   Have you contacted your pharmacy to request a refill? No   Which pharmacy would you like this sent to?     [id number] [place], [place] - [address] AT [facility] OF [address] & [address] [place] [address] Phone: [phone] Fax: [phone]  Patient notified that their request is being sent to the clinical staff for review and that they should receive a response within 2 business days.   Please advise at Mobile [phone] (mobile)   Pt called stating she just want this medication only going to location.

## 2023-02-27 DIAGNOSIS — M9906 Segmental and somatic dysfunction of lower extremity: Secondary | ICD-10-CM | POA: Diagnosis not present

## 2023-02-27 DIAGNOSIS — M9904 Segmental and somatic dysfunction of sacral region: Secondary | ICD-10-CM | POA: Diagnosis not present

## 2023-02-27 DIAGNOSIS — M9905 Segmental and somatic dysfunction of pelvic region: Secondary | ICD-10-CM | POA: Diagnosis not present

## 2023-02-27 DIAGNOSIS — M9903 Segmental and somatic dysfunction of lumbar region: Secondary | ICD-10-CM | POA: Diagnosis not present

## 2023-03-27 DIAGNOSIS — M9903 Segmental and somatic dysfunction of lumbar region: Secondary | ICD-10-CM | POA: Diagnosis not present

## 2023-03-27 DIAGNOSIS — M9904 Segmental and somatic dysfunction of sacral region: Secondary | ICD-10-CM | POA: Diagnosis not present

## 2023-03-27 DIAGNOSIS — M9906 Segmental and somatic dysfunction of lower extremity: Secondary | ICD-10-CM | POA: Diagnosis not present

## 2023-03-27 DIAGNOSIS — M9905 Segmental and somatic dysfunction of pelvic region: Secondary | ICD-10-CM | POA: Diagnosis not present

## 2023-04-24 DIAGNOSIS — M9906 Segmental and somatic dysfunction of lower extremity: Secondary | ICD-10-CM | POA: Diagnosis not present

## 2023-04-24 DIAGNOSIS — M9903 Segmental and somatic dysfunction of lumbar region: Secondary | ICD-10-CM | POA: Diagnosis not present

## 2023-04-24 DIAGNOSIS — M9904 Segmental and somatic dysfunction of sacral region: Secondary | ICD-10-CM | POA: Diagnosis not present

## 2023-04-24 DIAGNOSIS — M9905 Segmental and somatic dysfunction of pelvic region: Secondary | ICD-10-CM | POA: Diagnosis not present

## 2023-05-22 DIAGNOSIS — M9904 Segmental and somatic dysfunction of sacral region: Secondary | ICD-10-CM | POA: Diagnosis not present

## 2023-05-22 DIAGNOSIS — M9906 Segmental and somatic dysfunction of lower extremity: Secondary | ICD-10-CM | POA: Diagnosis not present

## 2023-05-22 DIAGNOSIS — M9905 Segmental and somatic dysfunction of pelvic region: Secondary | ICD-10-CM | POA: Diagnosis not present

## 2023-05-22 DIAGNOSIS — M9903 Segmental and somatic dysfunction of lumbar region: Secondary | ICD-10-CM | POA: Diagnosis not present

## 2023-06-19 DIAGNOSIS — M9904 Segmental and somatic dysfunction of sacral region: Secondary | ICD-10-CM | POA: Diagnosis not present

## 2023-06-19 DIAGNOSIS — M9906 Segmental and somatic dysfunction of lower extremity: Secondary | ICD-10-CM | POA: Diagnosis not present

## 2023-06-19 DIAGNOSIS — M9905 Segmental and somatic dysfunction of pelvic region: Secondary | ICD-10-CM | POA: Diagnosis not present

## 2023-06-19 DIAGNOSIS — M9903 Segmental and somatic dysfunction of lumbar region: Secondary | ICD-10-CM | POA: Diagnosis not present

## 2023-06-22 ENCOUNTER — Encounter: Payer: Self-pay | Admitting: Nurse Practitioner

## 2023-06-22 ENCOUNTER — Other Ambulatory Visit (HOSPITAL_COMMUNITY)
Admission: RE | Admit: 2023-06-22 | Discharge: 2023-06-22 | Disposition: A | Discharge status: Home / Self Care | Source: Ambulatory Visit | Attending: Nurse Practitioner | Admitting: Nurse Practitioner

## 2023-06-22 ENCOUNTER — Ambulatory Visit (INDEPENDENT_AMBULATORY_CARE_PROVIDER_SITE_OTHER): Payer: BC Managed Care – PPO | Admitting: Nurse Practitioner

## 2023-06-22 VITALS — BP 126/68 | Ht 65.0 in | Wt 139.0 lb

## 2023-06-22 DIAGNOSIS — M8589 Other specified disorders of bone density and structure, multiple sites: Secondary | ICD-10-CM

## 2023-06-22 DIAGNOSIS — Z1331 Encounter for screening for depression: Secondary | ICD-10-CM | POA: Diagnosis not present

## 2023-06-22 DIAGNOSIS — Z01419 Encounter for gynecological examination (general) (routine) without abnormal findings: Secondary | ICD-10-CM

## 2023-06-22 DIAGNOSIS — Z7689 Persons encountering health services in other specified circumstances: Secondary | ICD-10-CM

## 2023-06-22 DIAGNOSIS — Z124 Encounter for screening for malignant neoplasm of cervix: Secondary | ICD-10-CM | POA: Insufficient documentation

## 2023-06-22 DIAGNOSIS — Z780 Asymptomatic menopausal state: Secondary | ICD-10-CM | POA: Diagnosis not present

## 2023-06-22 NOTE — Progress Notes (Signed)
 Mysty [facility] December 07, 1960 [id number]   History:  63 y.o. G0 presents for annual exam. Postmenopausal - no HRT, no bleeding. Normal pap history. Very frustrated with her weight. Walks 2 miles most days, very conscientious of her diet, skips dinner or will eat an apple + almonds, saw dietician and gained weight with recommendations.   Gynecologic History No LMP recorded. Patient is postmenopausal.   Contraception/Family planning: post menopausal status Sexually active: No  Health Maintenance Last Pap: 12/04/2017. Results were: Normal neg HPV Last mammogram: 11/15/2022. Results were: Normal Last colonoscopy: 05/02/2013. Results were: Normal Last Dexa: 08/02/2021. Results were: T-score -1.9     06/22/2023   11:54 AM  Depression screen PHQ 2/9  Decreased Interest 0  Down, Depressed, Hopeless 0  PHQ - 2 Score 0     Past medical history, past surgical history, family history and social history were all reviewed and documented in the EPIC chart. Married. Works part-time at a preschool. Helps care for mother. Mother with history of endometrial cancer.   ROS:  A ROS was performed and pertinent positives and negatives are included.  Exam:  Vitals:   06/22/23 1150  BP: 126/68  Weight: 139 lb (63 kg)  Height: 5\' 5"  (1.651 m)   Body mass index is 23.13 kg/m.   General appearance:  Normal Thyroid:  Symmetrical, normal in size, without palpable masses or nodularity. Respiratory  Auscultation:  Clear without wheezing or rhonchi Cardiovascular  Auscultation:  Regular rate, without rubs, murmurs or gallops  Edema/varicosities:  Not grossly evident Abdominal  Soft,nontender, without masses, guarding or rebound.  Liver/spleen:  No organomegaly noted  Hernia:  None appreciated  Skin  Inspection:  Grossly normal Breasts: Examined lying and sitting.   Right: Without masses, retractions, nipple discharge or axillary adenopathy.   Left: Without masses, retractions, nipple  discharge or axillary adenopathy. Pelvic: External genitalia:  no lesions              Urethra:  normal appearing urethra with no masses, tenderness or lesions              Bartholins and Skenes: normal                 Vagina: normal appearing vagina with normal color and discharge, no lesions. Atrophy changes              Cervix: no lesions Bimanual Exam:  Uterus:  no masses or tenderness              Adnexa: no mass, fullness, tenderness              Rectovaginal: Deferred              Anus:  normal, no lesions  Patient informed chaperone available to be present for breast and pelvic exam. Patient has requested no chaperone to be present. Patient has been advised what will be completed during breast and pelvic exam.   Assessment/Plan:  63 y.o. G0 for annual exam.   Well female exam with routine gynecological exam - Education provided on SBEs, importance of preventative screenings, current guidelines, high calcium diet, regular exercise, and multivitamin daily. Labs with PCP.   Postmenopausal - no HRT, no bleeding  Cervical cancer screening - Plan: Cytology - PAP( [facility]). Normal pap history.   Osteopenia of multiple sites - 07/2021 T-score -1.9. Reports taking Fosamax in the past but did not tolerate. Takes Vit D + mag + K2. Walks regularly. Recommend repeating this  spring.   Encounter for weight management - Recommend high protein, low calorie, low sugar diet. Discussed intermittent fasting and incorporating weight training into exercise routine.   Screening for breast cancer - Normal mammogram history.  Continue annual screenings.  Normal breast exam today.  Screening for colon cancer - 04/2013 colonoscopy. Planning to call and schedule.   Return in about 1 year (around 06/21/2024) for Annual.     Olivia Mackie DNP, 12:20 PM 06/22/2023

## 2023-06-27 ENCOUNTER — Encounter: Payer: Self-pay | Admitting: Nurse Practitioner

## 2023-06-27 ENCOUNTER — Other Ambulatory Visit: Payer: Self-pay | Admitting: Nurse Practitioner

## 2023-06-27 DIAGNOSIS — N760 Acute vaginitis: Secondary | ICD-10-CM

## 2023-06-27 DIAGNOSIS — B9689 Other specified bacterial agents as the cause of diseases classified elsewhere: Secondary | ICD-10-CM

## 2023-06-27 LAB — CYTOLOGY - PAP
Comment: NEGATIVE
Diagnosis: NEGATIVE
High risk HPV: NEGATIVE

## 2023-06-27 MED ORDER — METRONIDAZOLE 500 MG PO TABS
500.0000 mg | ORAL_TABLET | Freq: Two times a day (BID) | ORAL | 0 refills | Status: DC
Start: 2023-06-27 — End: 2023-10-17

## 2023-07-03 NOTE — Telephone Encounter (Signed)
 If no symptoms may not need to do alternative treatment. But if she would like we can do Metrogel.

## 2023-07-04 NOTE — Telephone Encounter (Signed)
 No alternative treatment needed. Will monitor.

## 2023-08-01 DIAGNOSIS — L821 Other seborrheic keratosis: Secondary | ICD-10-CM | POA: Diagnosis not present

## 2023-08-01 DIAGNOSIS — D225 Melanocytic nevi of trunk: Secondary | ICD-10-CM | POA: Diagnosis not present

## 2023-08-01 DIAGNOSIS — L249 Irritant contact dermatitis, unspecified cause: Secondary | ICD-10-CM | POA: Diagnosis not present

## 2023-08-01 DIAGNOSIS — L814 Other melanin hyperpigmentation: Secondary | ICD-10-CM | POA: Diagnosis not present

## 2023-08-22 DIAGNOSIS — M9905 Segmental and somatic dysfunction of pelvic region: Secondary | ICD-10-CM | POA: Diagnosis not present

## 2023-08-22 DIAGNOSIS — M9903 Segmental and somatic dysfunction of lumbar region: Secondary | ICD-10-CM | POA: Diagnosis not present

## 2023-08-22 DIAGNOSIS — M9904 Segmental and somatic dysfunction of sacral region: Secondary | ICD-10-CM | POA: Diagnosis not present

## 2023-08-22 DIAGNOSIS — M9906 Segmental and somatic dysfunction of lower extremity: Secondary | ICD-10-CM | POA: Diagnosis not present

## 2023-09-25 DIAGNOSIS — M9905 Segmental and somatic dysfunction of pelvic region: Secondary | ICD-10-CM | POA: Diagnosis not present

## 2023-09-25 DIAGNOSIS — M9903 Segmental and somatic dysfunction of lumbar region: Secondary | ICD-10-CM | POA: Diagnosis not present

## 2023-09-25 DIAGNOSIS — M9904 Segmental and somatic dysfunction of sacral region: Secondary | ICD-10-CM | POA: Diagnosis not present

## 2023-09-25 DIAGNOSIS — M9906 Segmental and somatic dysfunction of lower extremity: Secondary | ICD-10-CM | POA: Diagnosis not present

## 2023-10-02 ENCOUNTER — Other Ambulatory Visit: Payer: Self-pay | Admitting: Internal Medicine

## 2023-10-02 DIAGNOSIS — Z1231 Encounter for screening mammogram for malignant neoplasm of breast: Secondary | ICD-10-CM

## 2023-10-05 ENCOUNTER — Encounter: Payer: Self-pay | Admitting: Gastroenterology

## 2023-10-11 DIAGNOSIS — M79671 Pain in right foot: Secondary | ICD-10-CM | POA: Diagnosis not present

## 2023-10-11 DIAGNOSIS — M79672 Pain in left foot: Secondary | ICD-10-CM | POA: Diagnosis not present

## 2023-10-17 ENCOUNTER — Ambulatory Visit (AMBULATORY_SURGERY_CENTER)

## 2023-10-17 ENCOUNTER — Encounter: Payer: Self-pay | Admitting: Gastroenterology

## 2023-10-17 VITALS — Ht 65.0 in | Wt 138.0 lb

## 2023-10-17 DIAGNOSIS — Z1211 Encounter for screening for malignant neoplasm of colon: Secondary | ICD-10-CM

## 2023-10-17 MED ORDER — NA SULFATE-K SULFATE-MG SULF 17.5-3.13-1.6 GM/177ML PO SOLN
1.0000 | Freq: Once | ORAL | 0 refills | Status: AC
Start: 2023-10-17 — End: 2023-10-17

## 2023-10-17 NOTE — Progress Notes (Signed)

## 2023-10-23 DIAGNOSIS — M9906 Segmental and somatic dysfunction of lower extremity: Secondary | ICD-10-CM | POA: Diagnosis not present

## 2023-10-23 DIAGNOSIS — M9904 Segmental and somatic dysfunction of sacral region: Secondary | ICD-10-CM | POA: Diagnosis not present

## 2023-10-23 DIAGNOSIS — M9903 Segmental and somatic dysfunction of lumbar region: Secondary | ICD-10-CM | POA: Diagnosis not present

## 2023-10-23 DIAGNOSIS — M9905 Segmental and somatic dysfunction of pelvic region: Secondary | ICD-10-CM | POA: Diagnosis not present

## 2023-10-26 DIAGNOSIS — H0015 Chalazion left lower eyelid: Secondary | ICD-10-CM | POA: Diagnosis not present

## 2023-10-30 ENCOUNTER — Telehealth: Payer: Self-pay | Admitting: Internal Medicine

## 2023-10-30 NOTE — Telephone Encounter (Signed)
 Patient requests TOC from Dr. Geofm. Please advise.

## 2023-11-01 ENCOUNTER — Ambulatory Visit (AMBULATORY_SURGERY_CENTER): Admitting: Gastroenterology

## 2023-11-01 ENCOUNTER — Encounter: Payer: Self-pay | Admitting: Gastroenterology

## 2023-11-01 VITALS — BP 115/74 | HR 71 | Temp 98.8°F | Resp 12 | Ht 65.0 in | Wt 138.0 lb

## 2023-11-01 DIAGNOSIS — Z1211 Encounter for screening for malignant neoplasm of colon: Secondary | ICD-10-CM | POA: Diagnosis not present

## 2023-11-01 MED ORDER — SODIUM CHLORIDE 0.9 % IV SOLN: 500.0000 mL | Freq: Once | INTRAVENOUS | Status: DC

## 2023-11-01 NOTE — Op Note (Signed)
 [facility] Patient Name: Jenny Haley Procedure Date: 11/01/2023 2:12 PM MRN: [id number] Endoscopist: Victory L. Legrand , MD, [id number] Age: 63 Referring MD:  Date of Birth: [birth year] Gender: Female Account #: [id number] Procedure:                Colonoscopy Indications:              Screening for colorectal malignant neoplasm                           no polyps Jan 2015 Medicines:                Monitored Anesthesia Care Procedure:                Pre-Anesthesia Assessment:                           - Prior to the procedure, a History and Physical                            was performed, and patient medications and                            allergies were reviewed. The patient's tolerance of                            previous anesthesia was also reviewed. The risks                            and benefits of the procedure and the sedation                            options and risks were discussed with the patient.                            All questions were answered, and informed consent                            was obtained. Prior Anticoagulants: The patient has                            taken no anticoagulant or antiplatelet agents. ASA                            Grade Assessment: II - A patient with mild systemic                            disease. After reviewing the risks and benefits,                            the patient was deemed in satisfactory condition to                            undergo the procedure.  After obtaining informed consent, the colonoscope                            was passed under direct vision. Throughout the                            procedure, the patient's blood pressure, pulse, and                            oxygen saturations were monitored continuously. The                            Olympus Scope [id number] was introduced through the                            anus and advanced to the the cecum,  identified by                            appendiceal orifice and ileocecal valve. The                            colonoscopy was performed without difficulty. The                            patient tolerated the procedure well. The quality                            of the bowel preparation was excellent. The                            ileocecal valve, appendiceal orifice, and rectum                            were photographed. Scope In: 2:18:10 PM Scope Out: 2:30:48 PM Scope Withdrawal Time: 0 hours 8 minutes 21 seconds  Total Procedure Duration: 0 hours 12 minutes 38 seconds  Findings:                 The perianal and digital rectal examinations were                            normal.                           The entire examined colon appeared normal on direct                            and retroflexion views. Complications:            No immediate complications. Estimated Blood Loss:     Estimated blood loss: none. Impression:               - The entire examined colon is normal on direct and                            retroflexion views.                           -  No specimens collected. Recommendation:           - Patient has a contact number available for                            emergencies. The signs and symptoms of potential                            delayed complications were discussed with the                            patient. Return to normal activities tomorrow.                            Written discharge instructions were provided to the                            patient.                           - Resume previous diet.                           - Continue present medications.                           - Repeat colonoscopy in 10 years for screening                            purposes. Jude Naclerio L. Legrand, MD 11/01/2023 2:33:01 PM This report has been signed electronically.

## 2023-11-01 NOTE — Progress Notes (Signed)
 Vss nad trans to pacu

## 2023-11-01 NOTE — Progress Notes (Signed)
 Pt's states no medical or surgical changes since previsit or office visit.

## 2023-11-01 NOTE — Patient Instructions (Signed)
 Resume previous diet and medications. Repeat Colonoscopy date in 10 years for surveillance.  YOU HAD AN ENDOSCOPIC PROCEDURE TODAY AT THE [facility]:   Refer to the procedure report that was given to you for any specific questions about what was found during the examination.  If the procedure report does not answer your questions, please call your gastroenterologist to clarify.  If you requested that your care partner not be given the details of your procedure findings, then the procedure report has been included in a sealed envelope for you to review at your convenience later.  YOU SHOULD EXPECT: Some feelings of bloating in the abdomen. Passage of more gas than usual.  Walking can help get rid of the air that was put into your GI tract during the procedure and reduce the bloating. If you had a lower endoscopy (such as a colonoscopy or flexible sigmoidoscopy) you may notice spotting of blood in your stool or on the toilet paper. If you underwent a bowel prep for your procedure, you may not have a normal bowel movement for a few days.  Please Note:  You might notice some irritation and congestion in your nose or some drainage.  This is from the oxygen used during your procedure.  There is no need for concern and it should clear up in a day or so.  SYMPTOMS TO REPORT IMMEDIATELY:  Following lower endoscopy (colonoscopy or flexible sigmoidoscopy):  Excessive amounts of blood in the stool  Significant tenderness or worsening of abdominal pains  Swelling of the abdomen that is new, acute  Fever of 100F or higher  For urgent or emergent issues, a gastroenterologist can be reached at any hour by calling [phone]. Do not use MyChart messaging for urgent concerns.    DIET:  We do recommend a small meal at first, but then you may proceed to your regular diet.  Drink plenty of fluids but you should avoid alcoholic beverages for 24 hours.  ACTIVITY:  You should plan to take it easy  for the rest of today and you should NOT DRIVE or use heavy machinery until tomorrow (because of the sedation medicines used during the test).    FOLLOW UP: Our staff will call the number listed on your records the next business day following your procedure.  We will call around 7:15- 8:00 am to check on you and address any questions or concerns that you may have regarding the information given to you following your procedure. If we do not reach you, we will leave a message.     If any biopsies were taken you will be contacted by phone or by letter within the next 1-3 weeks.  Please call us  at [phone] if you have not heard about the biopsies in 3 weeks.    SIGNATURES/CONFIDENTIALITY: You and/or your care partner have signed paperwork which will be entered into your electronic medical record.  These signatures attest to the fact that that the information above on your After Visit Summary has been reviewed and is understood.  Full responsibility of the confidentiality of this discharge information lies with you and/or your care-partner.

## 2023-11-01 NOTE — Progress Notes (Signed)
 History and Physical:  This patient presents for endoscopic testing for: Encounter Diagnosis  Name Primary?   Special screening for malignant neoplasms, colon Yes    Average risk for colorectal cancer.  2nd screening exam.  No polyps Jan 2015 Patient denies chronic abdominal pain, rectal bleeding, constipation or diarrhea.   Patient is otherwise without complaints or active issues today.   Past Medical History: Past Medical History:  Diagnosis Date   Allergy    Angioedema    eval by rheum and allergy dr fleeta smock now   Chicken pox    H/O angioedema    neg rheum pos rf dr lenon 2011   Infertility    Mitral valve prolapse    echo years ago fainting in childhood    Osteopenia    Scoliosis      Past Surgical History: Past Surgical History:  Procedure Laterality Date   LAPAROSCOPIC ABDOMINAL EXPLORATION  1992   Found minimal endometriosis   WISDOM TOOTH EXTRACTION      Allergies: Allergies  Allergen Reactions   Azithromycin Diarrhea and Nausea Only   Fosamax  [Alendronate ] Other (See Comments)    Abdominal pain   Penicillins Rash    REACTION: Rash    Outpatient Meds: Current Outpatient Medications  Medication Sig Dispense Refill   Calcium -Magnesium -Vitamin D  600-300-400 LIQD Taking bone supplement daily     EPINEPHrine  0.3 mg/0.3 mL IJ SOAJ injection Inject 0.3 mg into the muscle as needed. 1 each 3   Fexofenadine HCl (ALLEGRA ALLERGY PO) Take by mouth as needed.     Red Yeast Rice Extract (RED YEAST RICE PO) Take by mouth.     Current Facility-Administered Medications  Medication Dose Route Frequency Provider Last Rate Last Admin   0.9 %  sodium chloride  infusion  500 mL Intravenous Once Danis, Sabriah Hobbins L III, MD          ___________________________________________________________________ Objective   Exam:  BP 131/84   Pulse 91   Temp 98.8 F (37.1 C)   Ht 5' 5 (1.651 m)   Wt 138 lb (62.6 kg)   SpO2 99%   BMI 22.96 kg/m   CV: regular ,  S1/S2 Resp: clear to auscultation bilaterally, normal RR and effort noted GI: soft, no tenderness, with active bowel sounds.   Assessment: Encounter Diagnosis  Name Primary?   Special screening for malignant neoplasms, colon Yes     Plan: Colonoscopy   The benefits and risks of the planned procedure(s) were described in detail with the patient or (when appropriate) their health care proxy.  Risks were outlined as including, but not limited to, bleeding, infection, perforation, adverse medication reaction leading to cardiac or pulmonary decompensation, pancreatitis (if ERCP).  The limitation of incomplete mucosal visualization was also discussed.  No guarantees or warranties were given.  The patient is appropriate for an endoscopic procedure in the ambulatory setting.   - Victory Brand, MD

## 2023-11-02 ENCOUNTER — Telehealth: Payer: Self-pay

## 2023-11-02 NOTE — Telephone Encounter (Signed)
  Follow up Call-     11/01/2023    1:31 PM  Call back number  Post procedure Call Back phone  # [phone]  Permission to leave phone message Yes     Patient questions:  Do you have a fever, pain , or abdominal swelling? No. Pain Score  0 *  Have you tolerated food without any problems? Yes.    Have you been able to return to your normal activities? Yes.    Do you have any questions about your discharge instructions: Diet   No. Medications  No. Follow up visit  No.  Do you have questions or concerns about your Care? No.  Actions: * If pain score is 4 or above: No action needed, pain <4.

## 2023-11-16 ENCOUNTER — Ambulatory Visit

## 2023-11-22 ENCOUNTER — Ambulatory Visit
Admission: RE | Admit: 2023-11-22 | Discharge: 2023-11-22 | Disposition: A | Discharge status: Home / Self Care | Source: Ambulatory Visit | Attending: Internal Medicine | Admitting: Internal Medicine

## 2023-11-22 DIAGNOSIS — Z1231 Encounter for screening mammogram for malignant neoplasm of breast: Secondary | ICD-10-CM

## 2023-11-28 DIAGNOSIS — M9903 Segmental and somatic dysfunction of lumbar region: Secondary | ICD-10-CM | POA: Diagnosis not present

## 2023-11-28 DIAGNOSIS — M9905 Segmental and somatic dysfunction of pelvic region: Secondary | ICD-10-CM | POA: Diagnosis not present

## 2023-11-28 DIAGNOSIS — M9906 Segmental and somatic dysfunction of lower extremity: Secondary | ICD-10-CM | POA: Diagnosis not present

## 2023-11-28 DIAGNOSIS — M9904 Segmental and somatic dysfunction of sacral region: Secondary | ICD-10-CM | POA: Diagnosis not present

## 2023-12-07 ENCOUNTER — Encounter: Payer: Self-pay | Admitting: Physician Assistant

## 2023-12-07 ENCOUNTER — Ambulatory Visit: Admitting: Physician Assistant

## 2023-12-07 VITALS — BP 138/86 | HR 69 | Temp 98.0°F | Ht 66.5 in | Wt 142.2 lb

## 2023-12-07 DIAGNOSIS — Z131 Encounter for screening for diabetes mellitus: Secondary | ICD-10-CM | POA: Diagnosis not present

## 2023-12-07 DIAGNOSIS — K769 Liver disease, unspecified: Secondary | ICD-10-CM | POA: Diagnosis not present

## 2023-12-07 DIAGNOSIS — T783XXD Angioneurotic edema, subsequent encounter: Secondary | ICD-10-CM

## 2023-12-07 DIAGNOSIS — R635 Abnormal weight gain: Secondary | ICD-10-CM

## 2023-12-07 DIAGNOSIS — E7800 Pure hypercholesterolemia, unspecified: Secondary | ICD-10-CM

## 2023-12-07 DIAGNOSIS — T783XXS Angioneurotic edema, sequela: Secondary | ICD-10-CM | POA: Diagnosis not present

## 2023-12-07 DIAGNOSIS — R931 Abnormal findings on diagnostic imaging of heart and coronary circulation: Secondary | ICD-10-CM

## 2023-12-07 DIAGNOSIS — I700 Atherosclerosis of aorta: Secondary | ICD-10-CM

## 2023-12-07 DIAGNOSIS — M8580 Other specified disorders of bone density and structure, unspecified site: Secondary | ICD-10-CM

## 2023-12-07 DIAGNOSIS — I341 Nonrheumatic mitral (valve) prolapse: Secondary | ICD-10-CM

## 2023-12-07 DIAGNOSIS — Z1329 Encounter for screening for other suspected endocrine disorder: Secondary | ICD-10-CM | POA: Diagnosis not present

## 2023-12-07 DIAGNOSIS — Z780 Asymptomatic menopausal state: Secondary | ICD-10-CM

## 2023-12-07 LAB — CBC WITH DIFFERENTIAL/PLATELET
Basophils Absolute: 0 K/uL (ref 0.0–0.1)
Basophils Relative: 0.3 % (ref 0.0–3.0)
Eosinophils Absolute: 0.1 K/uL (ref 0.0–0.7)
Eosinophils Relative: 3 % (ref 0.0–5.0)
HCT: 41.1 % (ref 36.0–46.0)
Hemoglobin: 13.7 g/dL (ref 12.0–15.0)
Lymphocytes Relative: 33.7 % (ref 12.0–46.0)
Lymphs Abs: 1.3 K/uL (ref 0.7–4.0)
MCHC: 33.4 g/dL (ref 30.0–36.0)
MCV: 93.1 fl (ref 78.0–100.0)
Monocytes Absolute: 0.4 K/uL (ref 0.1–1.0)
Monocytes Relative: 11.2 % (ref 3.0–12.0)
Neutro Abs: 2 K/uL (ref 1.4–7.7)
Neutrophils Relative %: 51.8 % (ref 43.0–77.0)
Platelets: 317 K/uL (ref 150.0–400.0)
RBC: 4.41 Mil/uL (ref 3.87–5.11)
RDW: 13 % (ref 11.5–15.5)
WBC: 4 K/uL (ref 4.0–10.5)

## 2023-12-07 LAB — COMPREHENSIVE METABOLIC PANEL WITH GFR
ALT: 28 U/L (ref 0–35)
AST: 21 U/L (ref 0–37)
Albumin: 4.4 g/dL (ref 3.5–5.2)
Alkaline Phosphatase: 57 U/L (ref 39–117)
BUN: 13 mg/dL (ref 6–23)
CO2: 24 meq/L (ref 19–32)
Calcium: 9.5 mg/dL (ref 8.4–10.5)
Chloride: 104 meq/L (ref 96–112)
Creatinine, Ser: 0.73 mg/dL (ref 0.40–1.20)
GFR: 87.9 mL/min (ref >60.00)
Glucose, Bld: 92 mg/dL (ref 70–99)
Potassium: 3.9 meq/L (ref 3.5–5.1)
Sodium: 137 meq/L (ref 135–145)
Total Bilirubin: 0.5 mg/dL (ref 0.2–1.2)
Total Protein: 7.8 g/dL (ref 6.0–8.3)

## 2023-12-07 LAB — TSH: TSH: 2.22 u[IU]/mL (ref 0.35–5.50)

## 2023-12-07 LAB — LIPID PANEL
Cholesterol: 246 mg/dL — ABNORMAL HIGH (ref 0–200)
HDL: 75.8 mg/dL (ref >39.00)
LDL Cholesterol: 153 mg/dL — ABNORMAL HIGH (ref 0–99)
NonHDL: 170.2
Total CHOL/HDL Ratio: 3
Triglycerides: 84 mg/dL (ref 0.0–149.0)
VLDL: 16.8 mg/dL (ref 0.0–40.0)

## 2023-12-07 LAB — HEMOGLOBIN A1C: Hgb A1c MFr Bld: 5.9 % (ref 4.6–6.5)

## 2023-12-07 MED ORDER — EPINEPHRINE 0.3 MG/0.3ML IJ SOAJ: 0.3000 mg | INTRAMUSCULAR | 3 refills | Status: AC | PRN

## 2023-12-07 MED ORDER — ATORVASTATIN CALCIUM 10 MG PO TABS
10.0000 mg | ORAL_TABLET | Freq: Every day | ORAL | 2 refills | Status: DC
Start: 2023-12-07 — End: 2024-03-04

## 2023-12-07 NOTE — Patient Instructions (Signed)
 Welcome to [facility]! It was a pleasure meeting you today.   PLEASE NOTE:  If you had any LAB tests please let us know if you have not heard back within a few days. You may see your results on MyChart before we have a chance to review them but we will give you a call once they are reviewed by [place]. If we ordered any REFERRALS today, please let us know if you have not heard from their office within the next two weeks. Let us know through MyChart if you are needing REFILLS, or have your pharmacy send [place] the request. You can also use MyChart to communicate with me or any office staff.  Please try these tips to maintain a healthy lifestyle:  Eat most of your calories during the day when you are active. Eliminate processed foods including packaged sweets (pies, cakes, cookies), reduce intake of potatoes, white bread, white pasta, and white rice. Look for whole grain options, oat flour or almond flour.  Each meal should contain half fruits/vegetables, one quarter protein, and one quarter carbs (no bigger than a computer mouse).  Cut down on sweet beverages. This includes juice, soda, and sweet tea. Also watch fruit intake, though this is a healthier sweet option, it still contains natural sugar! Limit to 3 servings daily.  Drink at least 1 glass of water with each meal and aim for at least 8 glasses (64 ounces) per day.  Exercise at least 150 minutes every week to the best of your ability.    Take Care,  Audel Coakley, PA-C

## 2023-12-07 NOTE — Progress Notes (Signed)
 Patient ID: Almarie Vicci Held, female    DOB: 1961-03-18, 63 y.o.   MRN: [id number]   Assessment & Plan:  Angioedema, sequela -     EPINEPHrine ; Inject 0.3 mg into the muscle as needed.  Dispense: 1 each; Refill: 3  Liver lesion -     US  ABDOMEN LIMITED RUQ (LIVER/GB); Future -     CBC with Differential/Platelet -     Comprehensive metabolic panel with GFR  Pure hypercholesterolemia -     Lipid panel -     Atorvastatin  Calcium ; Take 1 tablet (10 mg total) by mouth at bedtime.  Dispense: 30 tablet; Refill: 2  Agatston coronary artery calcium  score of 1.98  (01/2022) -     Lipid panel  Screening for diabetes mellitus -     Comprehensive metabolic panel with GFR -     Hemoglobin A1c  Screening for thyroid  disorder -     TSH  Aortic atherosclerosis (HCC)  MVP (mitral valve prolapse)  Osteopenia after menopause  Abnormal weight gain    Assessment & Plan Hyperlipidemia with family history of premature cardiovascular disease Hyperlipidemia with a coronary calcium  score of 1.98, placing her in the 65th percentile for age, race, sex, and mass controls, indicating mild aortic atherosclerosis. Family history of premature cardiovascular disease, with her maternal grandmother having multiple heart attacks and dying at age 28. Open to starting cholesterol medication due to family history and existing plaque buildup. Discussed the risks and benefits of statins, including common side effects such as drowsiness, fatigue, and muscle aches. Recommended starting on a low dose due to sensitivity to medications. - Prescribe atorvastatin  10 mg to be taken in the evening. - Recommend taking CoQ10 and vitamin D  supplements to offset side effects of atorvastatin . - Order lipid panel in 3 months to assess response to atorvastatin .  Multiple liver lesions Multiple liver lesions identified on a CT scan two years ago, with the largest lesion measuring 5.4 cm. Previous assessment suggested  they are likely hepatic cysts or hemangiomas, not cancerous. Family history of liver issues. No symptoms such as right upper quadrant pain. Discussed the importance of follow-up imaging to monitor for changes in size or structure. - Order ultrasound of the right upper quadrant to assess liver lesions.  Angioedema due to environmental allergies Angioedema related to environmental allergies, including mold, dust, dust mites, leaves, and grass. Carries an EpiPen  for emergency use. No recent reactions but advised to keep EpiPen  on hand as a precaution. - Refill EpiPen  prescription at [facility].  Mitral valve prolapse Mitral valve prolapse previously followed by a cardiologist. No current symptoms, previously told no longer needed f/up.  Osteopenia Osteopenia managed with calcium  and vitamin D  supplementation. Discussed the importance of weight-bearing exercises to prevent progression to osteoporosis. - Continue calcium  and vitamin D  supplementation. - Encourage weight-bearing exercises, such as walking with light weights.  Abnormal weight gain Abnormal weight gain over the past four years, with a recent increase of five pounds since July. No signs of fluid overload or shortness of breath. Weight gain potentially related to grief and stress following the recent passing of her mother. Encouraged to continue healthy eating and exercise habits. - Monitor weight and fluid status. - Encourage self-care and stress management activities.  General Health Maintenance Discussed routine health maintenance, including Pap smears and bone density scans. Pap smears to continue until age 26, with the next one scheduled for spring. Bone density scan planned for next visit with GYN. - Schedule  Pap smear in spring if previous results are normal. - Plan bone density scan at next GYN visit.  Follow-Up Plan to follow up in three months to assess response to atorvastatin  and review lab results. - Schedule  follow-up appointment in three months. - Perform lab tests including blood count, CMP, cholesterol, thyroid , and A1c.      Return in about 3 months (around 03/08/2024) for recheck/follow-up, lipid panel .    Subjective:    Chief Complaint  Patient presents with   Transitions Of Care    Patient wants to discuss her cholesterol. And wants to discuss her weight. Wants to discuss a lesion and taking red yeast rice.     HPI Discussed the use of AI scribe software for clinical note transcription with the patient, who gave verbal consent to proceed.  History of Present Illness Feliz Herard is a 63 year old female who presents for evaluation of weight gain and concerns about cholesterol and liver lesions.  She has experienced weight gain over the past four years, with a notable increase of five pounds since her colonoscopy on November 01, 2023. She attributes this to postmenopausal changes and grief from caring for her mother, who passed away in 08-25-23. She maintains a healthy diet and exercises regularly, including walking and using a Wii Fit.  She is concerned about her cholesterol levels, with a family history of heart disease; her maternal grandmother had multiple heart attacks and died at age 30. She has been taking a red yeast rice supplement. Her coronary calcium  score from a CT scan two years ago was 1.98, placing her in the 65th percentile for her age. She was told there was already plaque building up in her aorta.  A CT scan two years ago revealed multiple liver lesions, with the largest being 5.4 cm. Her previous provider told her that the liver lesions were not cancer and nothing to worry about. She has not experienced any right upper quadrant pain and has not had a follow-up ultrasound.  She has a history of mitral valve prolapse, previously followed by a cardiologist, but was told she was healthy enough to discontinue follow-ups. She used to take antibiotics before  dental visits but was advised to stop.  She experiences angioedema related to environmental allergies, including mold, dust, dust mites, leaves, and grass. She carries an EpiPen  for this condition.  Her family history includes her father having esophageal cancer that metastasized to the liver and her mother having uterine cancer. She has no family history of liver issues.  She is sensitive to medications, with a history of adverse reactions to Fosamax , which caused stomach issues.  No right upper quadrant pain, fluid overload, swelling in legs, or shortness of breath. She feels tired, which she attributes to caring for her mother. No ear issues or other significant symptoms.     Past Medical History:  Diagnosis Date   Allergy    Angioedema    eval by rheum and allergy dr fleeta smock now   Chicken pox    H/O angioedema    neg rheum pos rf dr lenon 2011   Infertility    Mitral valve prolapse    echo years ago fainting in childhood    Osteopenia    Scoliosis     Past Surgical History:  Procedure Laterality Date   LAPAROSCOPIC ABDOMINAL EXPLORATION  1992   Found minimal endometriosis   WISDOM TOOTH EXTRACTION      Family History  Problem Relation  Age of Onset   Hyperlipidemia Mother    Hypertension Mother    Arthritis Mother        hands    Endometrial cancer Mother 67       surgery, radiation   Vaginal cancer Mother    Esophageal cancer Father        Esophageal with metastisis to the liver   Stroke Brother    Polycystic kidney disease Brother        congental cr 5 range, s/p transplant   Hypertension Brother    Colon cancer Neg Hx    Rectal cancer Neg Hx    Stomach cancer Neg Hx     Social History   Tobacco Use   Smoking status: Never   Smokeless tobacco: Never  Vaping Use   Vaping status: Never Used  Substance Use Topics   Alcohol use: No   Drug use: No     Allergies  Allergen Reactions   Azithromycin Diarrhea and Nausea Only   Fosamax   [Alendronate ] Other (See Comments)    Abdominal pain   Penicillins Rash    REACTION: Rash    Review of Systems NEGATIVE UNLESS OTHERWISE INDICATED IN HPI      Objective:     BP 138/86   Pulse 69   Temp 98 F (36.7 C) (Oral)   Ht 5' 6.5 (1.689 m)   Wt 142 lb 3.2 oz (64.5 kg)   SpO2 98%   BMI 22.61 kg/m   Wt Readings from Last 3 Encounters:  12/07/23 142 lb 3.2 oz (64.5 kg)  11/01/23 138 lb (62.6 kg)  10/17/23 138 lb (62.6 kg)    BP Readings from Last 3 Encounters:  12/07/23 138/86  11/01/23 115/74  06/22/23 126/68     Physical Exam Vitals and nursing note reviewed.  Constitutional:      Appearance: Normal appearance. She is normal weight. She is not toxic-appearing.  HENT:     Head: Normocephalic and atraumatic.  Eyes:     Extraocular Movements: Extraocular movements intact.     Conjunctiva/sclera: Conjunctivae normal.     Pupils: Pupils are equal, round, and reactive to light.  Cardiovascular:     Rate and Rhythm: Normal rate and regular rhythm.     Pulses: Normal pulses.     Heart sounds: Normal heart sounds.  Pulmonary:     Effort: Pulmonary effort is normal.     Breath sounds: Normal breath sounds.  Musculoskeletal:        General: Normal range of motion.     Cervical back: Normal range of motion and neck supple.  Skin:    General: Skin is warm and dry.  Neurological:     General: No focal deficit present.     Mental Status: She is alert and oriented to person, place, and time.  Psychiatric:        Mood and Affect: Mood normal.        Behavior: Behavior normal.             Scotlyn Mccranie M Allahna Husband, PA-C

## 2023-12-08 ENCOUNTER — Ambulatory Visit: Payer: Self-pay | Admitting: Physician Assistant

## 2023-12-20 ENCOUNTER — Encounter: Payer: BC Managed Care – PPO | Admitting: Internal Medicine

## 2023-12-25 DIAGNOSIS — M9906 Segmental and somatic dysfunction of lower extremity: Secondary | ICD-10-CM | POA: Diagnosis not present

## 2023-12-25 DIAGNOSIS — M9904 Segmental and somatic dysfunction of sacral region: Secondary | ICD-10-CM | POA: Diagnosis not present

## 2023-12-25 DIAGNOSIS — M9903 Segmental and somatic dysfunction of lumbar region: Secondary | ICD-10-CM | POA: Diagnosis not present

## 2023-12-25 DIAGNOSIS — M9905 Segmental and somatic dysfunction of pelvic region: Secondary | ICD-10-CM | POA: Diagnosis not present

## 2024-01-02 ENCOUNTER — Ambulatory Visit (HOSPITAL_BASED_OUTPATIENT_CLINIC_OR_DEPARTMENT_OTHER)
Admission: RE | Admit: 2024-01-02 | Discharge: 2024-01-02 | Disposition: A | Discharge status: Home / Self Care | Source: Ambulatory Visit | Attending: Physician Assistant | Admitting: Physician Assistant

## 2024-01-02 DIAGNOSIS — K769 Liver disease, unspecified: Secondary | ICD-10-CM | POA: Insufficient documentation

## 2024-01-02 DIAGNOSIS — K7689 Other specified diseases of liver: Secondary | ICD-10-CM | POA: Diagnosis not present

## 2024-01-08 NOTE — Telephone Encounter (Signed)
 See pt response as Jenny Haley

## 2024-01-22 DIAGNOSIS — M9906 Segmental and somatic dysfunction of lower extremity: Secondary | ICD-10-CM | POA: Diagnosis not present

## 2024-01-22 DIAGNOSIS — M9903 Segmental and somatic dysfunction of lumbar region: Secondary | ICD-10-CM | POA: Diagnosis not present

## 2024-01-22 DIAGNOSIS — M9905 Segmental and somatic dysfunction of pelvic region: Secondary | ICD-10-CM | POA: Diagnosis not present

## 2024-01-22 DIAGNOSIS — M9904 Segmental and somatic dysfunction of sacral region: Secondary | ICD-10-CM | POA: Diagnosis not present

## 2024-02-19 DIAGNOSIS — M9906 Segmental and somatic dysfunction of lower extremity: Secondary | ICD-10-CM | POA: Diagnosis not present

## 2024-02-19 DIAGNOSIS — M9903 Segmental and somatic dysfunction of lumbar region: Secondary | ICD-10-CM | POA: Diagnosis not present

## 2024-02-19 DIAGNOSIS — M9905 Segmental and somatic dysfunction of pelvic region: Secondary | ICD-10-CM | POA: Diagnosis not present

## 2024-02-19 DIAGNOSIS — M9904 Segmental and somatic dysfunction of sacral region: Secondary | ICD-10-CM | POA: Diagnosis not present

## 2024-03-04 ENCOUNTER — Other Ambulatory Visit: Payer: Self-pay | Admitting: Physician Assistant

## 2024-03-04 DIAGNOSIS — E7800 Pure hypercholesterolemia, unspecified: Secondary | ICD-10-CM

## 2024-03-05 ENCOUNTER — Telehealth: Payer: Self-pay

## 2024-03-05 ENCOUNTER — Other Ambulatory Visit: Payer: Self-pay | Admitting: Physician Assistant

## 2024-03-05 DIAGNOSIS — E7800 Pure hypercholesterolemia, unspecified: Secondary | ICD-10-CM

## 2024-03-05 NOTE — Telephone Encounter (Signed)
 Copied from CRM #[id number]. Topic: Clinical - Prescription Issue >> Mar 05, 2024  8:44 AM Frederich PARAS wrote: Reason for CRM: atorvastatin  (LIPITOR) 10 MG tablet Pt got her medication for a 3 mos supply her follow up apt 12/2. Pt needs medication to get her through 12/02 until the follow up apt. Can a bridge prescription be sent or done for the pt , so the pt does not miss medication.  Called pt and advised Rx for 30 day supply with 2 refills was sent to her pharmacy on 03/04/24. Verified sent to correct pharmacy. Nothing further needed at this time.

## 2024-03-05 NOTE — Telephone Encounter (Signed)
 Copied from CRM #[id number]. Topic: Clinical - Medication Refill >> Mar 05, 2024  8:51 AM Frederich PARAS wrote: Medication: atorvastatin  (LIPITOR) 10 MG tablet    Has the patient contacted their pharmacy? Yes They adv no refills on the medication   This is the patient's preferred pharmacy:  [id number] - [place], [place] - [address] [place] [address] Phone: [phone] Fax: [phone]  Is this the correct pharmacy for this prescription? Yes If no, delete pharmacy and type the correct one.   Has the prescription been filled recently? No  Is the patient out of the medication? No, 5 days left but not enough to get through 12/2 needs 2 days of supply   Has the patient been seen for an appointment in the last year OR does the patient have an upcoming appointment? Yes  Can we respond through MyChart? Yes  Agent: Please be advised that Rx refills may take up to 3 business days. We ask that you follow-up with your pharmacy.

## 2024-03-05 NOTE — Telephone Encounter (Signed)
 Patient was contacted this morning and advised 30 day supply and 2 refills sent on 03/04/24 and verified sent to correct pharmacy. Pt confirmed pharmacy correct. Nothing further is needed at this time.

## 2024-03-12 ENCOUNTER — Ambulatory Visit: Admitting: Physician Assistant

## 2024-03-12 ENCOUNTER — Encounter: Payer: Self-pay | Admitting: Physician Assistant

## 2024-03-12 ENCOUNTER — Ambulatory Visit: Payer: Self-pay | Admitting: Physician Assistant

## 2024-03-12 VITALS — BP 126/78 | HR 70 | Temp 98.0°F | Ht 66.5 in | Wt 143.0 lb

## 2024-03-12 DIAGNOSIS — Z8271 Family history of polycystic kidney: Secondary | ICD-10-CM

## 2024-03-12 DIAGNOSIS — K7689 Other specified diseases of liver: Secondary | ICD-10-CM

## 2024-03-12 DIAGNOSIS — Z8249 Family history of ischemic heart disease and other diseases of the circulatory system: Secondary | ICD-10-CM | POA: Diagnosis not present

## 2024-03-12 DIAGNOSIS — E7800 Pure hypercholesterolemia, unspecified: Secondary | ICD-10-CM | POA: Diagnosis not present

## 2024-03-12 DIAGNOSIS — D1801 Hemangioma of skin and subcutaneous tissue: Secondary | ICD-10-CM | POA: Insufficient documentation

## 2024-03-12 DIAGNOSIS — L814 Other melanin hyperpigmentation: Secondary | ICD-10-CM | POA: Insufficient documentation

## 2024-03-12 DIAGNOSIS — D225 Melanocytic nevi of trunk: Secondary | ICD-10-CM | POA: Insufficient documentation

## 2024-03-12 DIAGNOSIS — Z808 Family history of malignant neoplasm of other organs or systems: Secondary | ICD-10-CM | POA: Insufficient documentation

## 2024-03-12 DIAGNOSIS — D1800 Hemangioma unspecified site: Secondary | ICD-10-CM | POA: Insufficient documentation

## 2024-03-12 LAB — LIPID PANEL
Cholesterol: 187 mg/dL (ref 0–200)
HDL: 83.8 mg/dL (ref >39.00)
LDL Cholesterol: 88 mg/dL (ref 0–99)
NonHDL: 103.04
Total CHOL/HDL Ratio: 2
Triglycerides: 77 mg/dL (ref 0.0–149.0)
VLDL: 15.4 mg/dL (ref 0.0–40.0)

## 2024-03-12 NOTE — Progress Notes (Signed)
 Patient ID: Jenny Haley, female    DOB: 03/31/1961, 63 y.o.   MRN: [id number]   Assessment & Plan:  Pure hypercholesterolemia -     Lipid panel; Future  Benign liver cyst  Family history of polycystic kidney disease    Assessment & Plan   Assessment and Plan    Pure hypercholesterolemia Family history of premature cardiovascular disease. Coronary calcium  score of 1.98, placing her in the 65th percentile for age, race, and sex. Maternal grandmother had multiple myocardial infarctions and died at age 65. Currently on atorvastatin  10 mg in the evening. Initial cough resolved, likely unrelated to atorvastatin . LDL goal is to reduce below 100 mg/dL. No personal major cardiovascular history. - Rechecked fasting lipid panel today. - Continue atorvastatin  10 mg in the evening. - Consider CoQ10 supplementation at a lower dose due to low atorvastatin  dose.  Multiple benign liver cysts Multiple benign liver cysts, largest measuring 5x4x5 cm. Recent liver ultrasound was reassuring with no further imaging needed. No current symptoms or complications. No gallstones present. Family history of polycystic kidney disease noted, but no personal history of renal issues. Liver function tests in August were normal. - Consider renal ultrasound due to multiple liver cysts, brother with PCKD, age of patient; childhood US  normal per patient, but checked UTD medical literature that suggested cysts could still develop. Will send MyChart message to patient to see her thoughts on completing renal US .  - Should not need any further f/up on liver cysts          Return in about 8 months (around 11/10/2024) for physical, fasting labs .    Subjective:    Chief Complaint  Patient presents with   Medical Management of Chronic Issues    Pt in office for 3 mon f/u and per last office note needing lipid panel checked; pt is not fasting today but willing to come back fasting for labs;      HPI Discussed the use of AI scribe software for clinical note transcription with the patient, who gave verbal consent to proceed.  History of Present Illness  Discussed the use of AI scribe software for clinical note transcription with the patient, who gave verbal consent to proceed.  History of Present Illness   Jenny Haley is a 63 year old female with hyperlipidemia who presents for a three-month follow-up visit.  She was started on atorvastatin  10 mg to be taken in the evening for hyperlipidemia. Initially, she experienced a cough, which has since subsided, and she is unsure if it was related to the medication or allergies. Her last LDL was 153, and she is aiming to reduce it to below 100.  She has multiple benign noncancerous cysts on her liver. A recent liver ultrasound was performed, and she reports soreness for two weeks following the ultrasound, likely due to the pressure applied during the procedure. She has no known issues with her kidneys, and her liver and kidney function tests in August were normal. There is a family history of polycystic kidney disease, as her brother was diagnosed at birth and underwent a kidney transplant ten years ago. His daughter also has a few cysts on her liver.  She maintains an active lifestyle, incorporating walking and exercises on a rebounder trampoline into her routine. She has not noticed any weight loss but feels her legs are stronger.          Past Medical History:  Diagnosis Date   Allergy  Angioedema    eval by rheum and allergy dr fleeta smock now   Chicken pox    H/O angioedema    neg rheum pos rf dr lenon 2011   Infertility    Mitral valve prolapse    echo years ago fainting in childhood    Osteopenia    Scoliosis     Past Surgical History:  Procedure Laterality Date   LAPAROSCOPIC ABDOMINAL EXPLORATION  1992   Found minimal endometriosis   WISDOM TOOTH EXTRACTION      Family History  Problem  Relation Age of Onset   Hyperlipidemia Mother    Hypertension Mother    Arthritis Mother        hands    Endometrial cancer Mother 31       surgery, radiation   Vaginal cancer Mother    Esophageal cancer Father        Esophageal with metastisis to the liver   Stroke Brother    Polycystic kidney disease Brother        congental cr 5 range, s/p transplant   Hypertension Brother    Colon cancer Neg Hx    Rectal cancer Neg Hx    Stomach cancer Neg Hx     Social History   Tobacco Use   Smoking status: Never   Smokeless tobacco: Never  Vaping Use   Vaping status: Never Used  Substance Use Topics   Alcohol use: No   Drug use: No     Allergies  Allergen Reactions   Azithromycin Diarrhea and Nausea Only   Fosamax  [Alendronate ] Other (See Comments)    Abdominal pain   Penicillins Rash    REACTION: Rash    Review of Systems NEGATIVE UNLESS OTHERWISE INDICATED IN HPI      Objective:     BP 126/78 (BP Location: Left Arm, Patient Position: Sitting, Cuff Size: Normal)   Pulse 70   Temp 98 F (36.7 C) (Temporal)   Ht 5' 6.5 (1.689 m)   Wt 143 lb (64.9 kg)   SpO2 99%   BMI 22.74 kg/m   Wt Readings from Last 3 Encounters:  03/12/24 143 lb (64.9 kg)  12/07/23 142 lb 3.2 oz (64.5 kg)  11/01/23 138 lb (62.6 kg)    BP Readings from Last 3 Encounters:  03/12/24 126/78  12/07/23 138/86  11/01/23 115/74     Physical Exam Vitals and nursing note reviewed.  Constitutional:      Appearance: Normal appearance. She is normal weight. She is not toxic-appearing.  HENT:     Head: Normocephalic and atraumatic.  Eyes:     Extraocular Movements: Extraocular movements intact.     Conjunctiva/sclera: Conjunctivae normal.     Pupils: Pupils are equal, round, and reactive to light.  Cardiovascular:     Rate and Rhythm: Normal rate and regular rhythm.     Pulses: Normal pulses.  Pulmonary:     Effort: Pulmonary effort is normal.  Musculoskeletal:        General: Normal  range of motion.  Skin:    General: Skin is warm and dry.  Neurological:     General: No focal deficit present.     Mental Status: She is alert and oriented to person, place, and time.  Psychiatric:        Mood and Affect: Mood normal.        Behavior: Behavior normal.             Brandan Glauber M Artelia Game, PA-C

## 2024-03-13 NOTE — Telephone Encounter (Signed)
 Please see pt reply to previous message

## 2024-03-13 NOTE — Progress Notes (Signed)
 Patient has read MyChart message   Last read by Almarie Vicci Held at 6:12AM on 03/13/2024.

## 2024-03-25 DIAGNOSIS — M9906 Segmental and somatic dysfunction of lower extremity: Secondary | ICD-10-CM | POA: Diagnosis not present

## 2024-03-25 DIAGNOSIS — M9905 Segmental and somatic dysfunction of pelvic region: Secondary | ICD-10-CM | POA: Diagnosis not present

## 2024-03-25 DIAGNOSIS — M9904 Segmental and somatic dysfunction of sacral region: Secondary | ICD-10-CM | POA: Diagnosis not present

## 2024-03-25 DIAGNOSIS — M9903 Segmental and somatic dysfunction of lumbar region: Secondary | ICD-10-CM | POA: Diagnosis not present

## 2024-06-25 ENCOUNTER — Ambulatory Visit: Admitting: Nurse Practitioner

## 2024-11-14 ENCOUNTER — Encounter: Admitting: Physician Assistant
